# Patient Record
Sex: Male | Born: 1970 | Race: White | Hispanic: No | Marital: Single | Smoking: Never smoker
Health system: Southern US, Community
[De-identification: ages and names within clinical notes are randomized; demographics above are authoritative.]

## PROBLEM LIST (undated history)

## (undated) DIAGNOSIS — I10 Essential (primary) hypertension: Secondary | ICD-10-CM

## (undated) DIAGNOSIS — E781 Pure hyperglyceridemia: Secondary | ICD-10-CM

## (undated) DIAGNOSIS — Z800 Family history of malignant neoplasm of digestive organs: Secondary | ICD-10-CM

## (undated) DIAGNOSIS — E119 Type 2 diabetes mellitus without complications: Secondary | ICD-10-CM

## (undated) DIAGNOSIS — E559 Vitamin D deficiency, unspecified: Secondary | ICD-10-CM

## (undated) DIAGNOSIS — K859 Acute pancreatitis without necrosis or infection, unspecified: Secondary | ICD-10-CM

## (undated) DIAGNOSIS — K760 Fatty (change of) liver, not elsewhere classified: Secondary | ICD-10-CM

## (undated) DIAGNOSIS — IMO0002 Reserved for concepts with insufficient information to code with codable children: Secondary | ICD-10-CM

## (undated) DIAGNOSIS — J309 Allergic rhinitis, unspecified: Secondary | ICD-10-CM

## (undated) HISTORY — DX: Family history of malignant neoplasm of digestive organs: Z80.0

## (undated) HISTORY — DX: Reserved for concepts with insufficient information to code with codable children: IMO0002

## (undated) HISTORY — DX: Hypercalcemia: E83.52

## (undated) HISTORY — DX: Type 2 diabetes mellitus without complications: E11.9

## (undated) HISTORY — DX: Fatty (change of) liver, not elsewhere classified: K76.0

## (undated) HISTORY — DX: Pure hyperglyceridemia: E78.1

## (undated) HISTORY — DX: Vitamin D deficiency, unspecified: E55.9

## (undated) HISTORY — DX: Allergic rhinitis, unspecified: J30.9

## (undated) HISTORY — DX: Essential (primary) hypertension: I10

## (undated) HISTORY — DX: Acute pancreatitis without necrosis or infection, unspecified: K85.90

---

## 2006-07-07 ENCOUNTER — Ambulatory Visit: Payer: Self-pay | Admitting: Gastroenterology

## 2012-09-26 LAB — URINALYSIS, COMPLETE
Bilirubin,UR: NEGATIVE
Glucose,UR: >=500 mg/dL (ref 0–75)
Leukocyte Esterase: NEGATIVE
Nitrite: NEGATIVE
Ph: 5 (ref 4.5–8.0)
Protein: 100
RBC,UR: 5 /HPF (ref 0–5)
WBC UR: <1 /HPF (ref 0–5)

## 2012-09-26 LAB — CBC
HCT: 43.6 % (ref 40.0–52.0)
Platelet: 344 10*3/uL (ref 150–440)
RBC: 5.18 10*6/uL (ref 4.40–5.90)
RDW: 12.9 % (ref 11.5–14.5)
WBC: 19.7 10*3/uL — ABNORMAL HIGH (ref 3.8–10.6)

## 2012-09-27 ENCOUNTER — Inpatient Hospital Stay: Payer: Self-pay | Admitting: Internal Medicine

## 2012-09-27 LAB — COMPREHENSIVE METABOLIC PANEL
Alkaline Phosphatase: 126 U/L (ref 50–136)
Anion Gap: 10 (ref 7–16)
Bilirubin,Total: 0.8 mg/dL (ref 0.2–1.0)
Calcium, Total: 9.2 mg/dL (ref 8.5–10.1)
Chloride: 93 mmol/L — ABNORMAL LOW (ref 98–107)
Co2: 26 mmol/L (ref 21–32)
Creatinine: 0.66 mg/dL (ref 0.60–1.30)
EGFR (African American): >60
EGFR (Non-African Amer.): >60
Osmolality: 273 (ref 275–301)
SGOT(AST): 60 U/L — ABNORMAL HIGH (ref 15–37)
SGPT (ALT): 50 U/L (ref 12–78)
Sodium: 129 mmol/L — ABNORMAL LOW (ref 136–145)

## 2012-09-27 LAB — LIPASE, BLOOD
Lipase: >10000 U/L — ABNORMAL HIGH (ref 73–393)
Lipase: 6131 U/L — ABNORMAL HIGH (ref 73–393)

## 2012-09-28 LAB — LIPID PANEL
Cholesterol: 381 mg/dL — ABNORMAL HIGH
HDL Cholesterol: 24 mg/dL — ABNORMAL LOW
Triglycerides: 1302 mg/dL — ABNORMAL HIGH

## 2012-09-28 LAB — CBC WITH DIFFERENTIAL/PLATELET
Basophil #: 0.1 10*3/uL (ref 0.0–0.1)
Basophil %: 0.8 %
Eosinophil #: 0.1 10*3/uL (ref 0.0–0.7)
MCH: 30.2 pg (ref 26.0–34.0)
MCV: 84 fL (ref 80–100)
Monocyte #: 0.6 x10 3/mm (ref 0.2–1.0)
Monocyte %: 4.3 %
Neutrophil %: 83.2 %
RBC: 4.53 10*6/uL (ref 4.40–5.90)

## 2012-09-28 LAB — BASIC METABOLIC PANEL
Calcium, Total: 8.4 mg/dL — ABNORMAL LOW (ref 8.5–10.1)
Co2: 19 mmol/L — ABNORMAL LOW (ref 21–32)
EGFR (Non-African Amer.): >60
Osmolality: 273 (ref 275–301)
Potassium: 3.8 mmol/L (ref 3.5–5.1)
Sodium: 134 mmol/L — ABNORMAL LOW (ref 136–145)

## 2012-09-28 LAB — LIPASE, BLOOD: Lipase: 2910 U/L — ABNORMAL HIGH

## 2012-10-01 LAB — CULTURE, BLOOD (SINGLE)

## 2014-03-12 ENCOUNTER — Ambulatory Visit: Payer: Self-pay | Admitting: Urgent Care

## 2014-03-12 LAB — IRON AND TIBC
Iron Bind.Cap.(Total): 387 ug/dL (ref 250–450)
Iron Saturation: 21 %
Iron: 82 ug/dL (ref 65–175)
Unbound Iron-Bind.Cap.: 305 ug/dL

## 2014-03-12 LAB — FERRITIN: Ferritin (ARMC): 379 ng/mL (ref 8–388)

## 2014-03-13 ENCOUNTER — Ambulatory Visit: Payer: Self-pay | Admitting: Urgent Care

## 2014-03-14 ENCOUNTER — Emergency Department: Payer: Self-pay | Admitting: Emergency Medicine

## 2014-03-21 ENCOUNTER — Ambulatory Visit: Payer: Self-pay | Admitting: Urgent Care

## 2014-04-02 ENCOUNTER — Ambulatory Visit: Payer: Self-pay | Admitting: Urgent Care

## 2014-04-03 ENCOUNTER — Other Ambulatory Visit: Payer: Self-pay | Admitting: Urgent Care

## 2014-04-04 ENCOUNTER — Other Ambulatory Visit: Payer: Self-pay | Admitting: Urgent Care

## 2014-05-02 ENCOUNTER — Ambulatory Visit: Payer: Self-pay | Admitting: Surgery

## 2014-05-02 LAB — CBC WITH DIFFERENTIAL/PLATELET
Basophil #: 0.1 10*3/uL (ref 0.0–0.1)
Basophil %: 1.3 %
Eosinophil #: 0.1 10*3/uL (ref 0.0–0.7)
Eosinophil %: 2.4 %
HCT: 41.4 % (ref 40.0–52.0)
HGB: 14.2 g/dL (ref 13.0–18.0)
LYMPHS ABS: 1.5 10*3/uL (ref 1.0–3.6)
Lymphocyte %: 26.3 %
MCH: 29.1 pg (ref 26.0–34.0)
MCHC: 34.3 g/dL (ref 32.0–36.0)
MCV: 85 fL (ref 80–100)
MONO ABS: 0.4 x10 3/mm (ref 0.2–1.0)
Monocyte %: 6.2 %
Neutrophil #: 3.7 10*3/uL (ref 1.4–6.5)
Neutrophil %: 63.8 %
PLATELETS: 296 10*3/uL (ref 150–440)
RBC: 4.88 10*6/uL (ref 4.40–5.90)
RDW: 12.9 % (ref 11.5–14.5)
WBC: 5.7 10*3/uL (ref 3.8–10.6)

## 2014-05-02 LAB — BASIC METABOLIC PANEL
Anion Gap: 12 (ref 7–16)
BUN: 15 mg/dL
CHLORIDE: 99 mmol/L — AB
Calcium, Total: 9.5 mg/dL
Co2: 24 mmol/L
Creatinine: 0.74 mg/dL
EGFR (African American): >60
EGFR (Non-African Amer.): >60
GLUCOSE: 224 mg/dL — AB
Potassium: 3.7 mmol/L
Sodium: 135 mmol/L

## 2014-05-02 LAB — PROTIME-INR
INR: 1
PROTHROMBIN TIME: 13 s

## 2014-05-03 LAB — HEPATIC FUNCTION PANEL A (ARMC)
Albumin: 4.5 g/dL
Alkaline Phosphatase: 56 U/L
Bilirubin, Direct: <0.1 mg/dL
Bilirubin,Total: 0.4 mg/dL
SGOT(AST): 104 U/L — ABNORMAL HIGH
SGPT (ALT): 135 U/L — ABNORMAL HIGH
Total Protein: 8.1 g/dL

## 2014-05-09 ENCOUNTER — Ambulatory Visit
Admit: 2014-05-09 | Disposition: A | Discharge status: Home / Self Care | Payer: Self-pay | Attending: Surgery | Admitting: Surgery

## 2014-05-09 HISTORY — PX: LAPAROSCOPIC CHOLECYSTECTOMY: SUR755

## 2014-05-23 ENCOUNTER — Other Ambulatory Visit
Admit: 2014-05-23 | Disposition: A | Discharge status: Home / Self Care | Payer: Self-pay | Attending: Urgent Care | Admitting: Urgent Care

## 2014-05-23 LAB — HEPATIC FUNCTION PANEL A (ARMC)
AST: 99 U/L — AB
Albumin: 4.2 g/dL
Alkaline Phosphatase: 67 U/L
Bilirubin, Direct: <0.1 mg/dL
Bilirubin,Total: 0.6 mg/dL
SGPT (ALT): 127 U/L — ABNORMAL HIGH
Total Protein: 7.9 g/dL

## 2014-05-25 NOTE — H&P (Signed)
PATIENT NAME:  Gregory Bowman, Gregory Bowman MR#:  [id number] DATE OF BIRTH:  [birth year]  DATE OF ADMISSION:  09/27/2012  PRIMARY CARE PHYSICIAN: Dr. Rosario Jacks    REFERRING PHYSICIAN: Dr. Dahlia Client.  CHIEF COMPLAINT: Abdominal pain.   HISTORY OF PRESENT ILNESS:  Gregory Bowman is a 44 year old pleasant white male with a past medical history of diabetes mellitus on oral medication, hypertension, hyperlipidemia, presented to the Emergency Department with a complaint of abdominal pain  that started on Sunday afternoon. The patient had an episode of diarrhea on Sunday morning.  He ate salad around lunch time.  He started to experience pain in the epigastric and above the umbilicus, constant pain.  He felt nauseated a few times, did not have any episodes of vomiting.  As the patient continued to have this pain, he came to the Emergency Department. Work-up in the Emergency Department revealed the patient has a lipase of greater than 10,000. Right upper quadrant ultrasound was done which was negative for any gallstones. He drinks alcohol occasionally; however, denies drinking any alcohol recently. The blood drawn in the Emergency Department showed severely lipemic. The patient does not remember his last hemoglobin A1c.   PAST MEDICAL HISTORY: 1.  Hypertension.  2.  Hyperlipidemia.  3.  Diabetes mellitus, on oral medication.   PAST SURGICAL HISTORY:  A cyst removed from the back.   ALLERGIES: No known drug allergies.   HOME MEDICATIONS: 1.  Onglyza 5 mg 1 tablet once a day.  2.  Metoprolol 25 mg 2 times a day.  3.  Metformin 1000 mg 2 times a day.  4.  Lisinopril 5 mg once a day.   SOCIAL HISTORY: Denies smoking, drinking alcohol or using illicit drugs. He lives with his mother. Works Therapist, art.    FAMILY HISTORY: Father with colon cancer, heart problems and diabetes mellitus. Mother with multiple medical problems including hypertension, hyperlipidemia, diabetes mellitus and colon cancer.   REVIEW OF  SYSTEMS: CONSTITUTIONAL: Denies any generalized weakness.  EYES: No change in vision.  ENT: No change in hearing. No sore throat.  RESPIRATORY: No cough, shortness of breath.  CARDIOVASCULAR: No chest pain, palpitations.  GASTROINTESTINAL:  He has abdominal pain. No nausea or vomiting.  GENITOURINARY: No dysuria or hematuria.  SKIN: No rash or lesions.  ENDOCRINE: No polyuria or polydipsia; however, the patient has diagnosis of diabetes mellitus.  HEMATOLOGY: No easy bruising and bleeding.  MUSCULOSKELETAL: No joint pains and aches.  NEUROLOGIC:  No weakness or numbness in any part of the body.  PSYCHIATRIC: Denies any anxiety, depression.   PHYSICAL EXAMINATION: GENERAL: This is a well built, well-nourished, age-appropriate male lying down in the bed, not in distress.  VITAL SIGNS: Temperature is 98.7, pulse 112, blood pressure 147/90, respiratory rate of 18, oxygen saturation 98% on room air.  HEENT: Head normocephalic, atraumatic.  No scleral icterus. Conjunctivae normal. Pupils equal and reactive to light. Mucous membranes dry. No pharyngeal erythema.  NECK: Supple. No lymphadenopathy. No JVD. No carotid bruit. No thyromegaly.  CHEST: Has no focal tenderness.  LUNGS:  Bilateral clear to auscultation.    HEART: S1, S2, regular. No murmurs are heard. No pedal edema. Pulses 2+ in both feet.  ABDOMEN: Bowel sounds plus. Soft, has mild tenderness in the epigastric area. No rebound or guarding. Could not appreciate hepatosplenomegaly secondary to the patient's pain.  SKIN: No rash or lesions.  MUSCULOSKELETAL: Good range of motion in all the extremities.  NEUROLOGIC: The patient is alert, oriented to place, person  and time. Cranial nerves II through XII intact. Motor 5/5 in upper and lower extremities.  No sensory deficits.   LABORATORY DATA:  Complete metabolic panel: Sodium 242, chloride 89, calcium   CBC: WBC of 19.7, hemoglobin 43, platelet count of 344. Urinalysis negative for nitrites  and leukocyte esterase. Lipase greater than 10,000. The rest of all the values are within normal limits.   ASSESSMENT AND PLAN: Gregory Bowman is a 44 year old male who comes to the Emergency Department with acute pancreatitis.  1. Acute pancreatitis. No history of alcohol use, negative for any gallstones. Very highly concerning about hypertriglyceridemia. Admit the patient to the medical bed to continue with IV fluids, pain management. Obtain lipid profile. Until then, we will keep the patient on Lopid.  2.  Diabetes mellitus. We will hold the Onglyza and metformin; keep the patient on sliding scale insulin.  3.  Hypertension. Currently well controlled, we will hold lisinopril for now.  4.  Keep the patient on deep vein thrombosis prophylaxis with Lovenox.  5.  Hyponatremia most likely secondary to hypertriglyceridemia and the dehydration. Continue with IV fluids and ordered lipid profile.  TIme spent 45 min      ____________________________ Monica Becton, MD pv:cc D: 09/27/2012 01:49:00 ET T: 09/27/2012 02:47:13 ET JOB#: [id number]  cc: Monica Becton, MD, <Dictator> Grier Mitts Merion Caton MD ELECTRONICALLY SIGNED 10/05/2012 22:10

## 2014-05-25 NOTE — Discharge Summary (Signed)
PATIENT NAME:  Gregory Bowman, Gregory Bowman MR#:  [id number] DATE OF BIRTH:  [birth year]  DATE OF ADMISSION:  09/27/2012 DATE OF DISCHARGE:  09/29/2012  DISCHARGE DIAGNOSES: 1.  Acute pancreatitis secondary to hypertriglyceridemia. Abdominal pain resolved. 2.  Severe hypertriglyceridemia.  3.  Hypertension.  4.  Diabetes mellitus type 2.  DISCHARGE MEDICATIONS: 1.  Metformin 1 gram p.o. b.i.d.  2.  Lisinopril 5 mg p.o. daily. 3.  Metoprolol 25 mg p.o. b.i.d. 4.  Onglyza 5 mg p.o. daily.  5.  Gemfibrozil 600 mg p.o. b.i.d.  6.  Omega 3 fatty acids 2 grams p.o. b.i.d.  7.  Levaquin 500 mg every 24 hours for 5 days.   DIET: Low fat, ADA.  CONSULTATIONS: None.   PRIMARY CARE PHYSICIAN: Casilda Carls, MD; the patient is to follow up with him in 3 months.   HOSPITAL COURSE: A 44 year old male patient admitted for abdominal pain and acute pancreatitis. The patient's lipase on admission 10,000. The patient was kept n.p.o., started on IV fluids. He was also started on empiric antibiotics because his white count was 19.7 on admission. The patient's abdominal pain resolved the following day, and the patient did not have any nausea. Started on liquid diet. Lipase did come down from 10,000 to 6,000. The patient's LFTs were within normal limits with AST 50 ALT 60 and alkaline phosphatase 126. He also had an abdominal ultrasound which showed hepatic steatosis without any cholecystitis. The patient's lipase was 851 yesterday. He did not have anymore abdominal pain. The diet was advanced to regular diet. His labs showed LDL unable to be commented because his triglycerides are very elevated at 1302. Started on Lopid and also omega-3 fatty acids. The patient is advised to continue Lopid and also omega-3 fatty acids. The patient advised to be on low fat diet, follow with Dr. Rosario Jacks in about 3 months to have repeat triglyceride level checked. The patient understands these instructions, and we gave a prescription for  gemfibrozil and Levaquin. The patient's white count did improve from 19.7 to 14.8 at the time of discharge. He was afebrile, tolerating the diet. We did not give him Metformin when he came because he was n.p.o. and had dehydration, but I told him to restart the metformin at the time of discharge. The patient's blood pressure is fine and he is stable at the time of discharge.   TIME SPENT: More than 30 minutes. ____________________________ Epifanio Lesches, MD sk:sb D: 09/30/2012 13:46:36 ET T: 09/30/2012 14:07:51 ET JOB#: [id number]  cc: Epifanio Lesches, MD, <Dictator> Isla Pence, MD Epifanio Lesches MD ELECTRONICALLY SIGNED 10/14/2012 22:20

## 2014-05-28 LAB — SURGICAL PATHOLOGY

## 2014-06-03 NOTE — H&P (Signed)
PATIENT NAME:  Gregory Bowman, Gregory Bowman MR#:  [id number] DATE OF BIRTH:  28-Dec-1970  DATE OF ADMISSION:  05/09/2014  CHIEF COMPLAINT: History of pancreatitis.   HISTORY OF PRESENT ILLNESS: This patient has had recurrent pancreatitis of unclear etiology, has not required hospitalization, but he has had signs of pancreatitis in the past. He has known gallstones, but the etiology of his pancreatitis has never been clearly related to his gallstones, but no other etiologies have been identified.  His liver function tests have not been elevated in the past, but with known gallbladder disease laparoscopic cholecystectomy is warranted with the patient fully understanding that this may not resolve all of his symptoms or his risk for future pancreatitis, as the link to his gallbladder is not clearcut at this point.   The patient has been seen by Vickey Huger, NP in Dr. Dorothey Baseman office for workup of pancreatitis and we have discussed his history and the agreement for laparoscopic cholecystectomy is in place.   PAST SURGICAL HISTORY: None.   PAST MEDICAL HISTORY: Hepatic steatosis, prior elevated liver function tests, pancreatitis, diabetes, allergic rhinitis, hypertriglyceridemia, hypertension, hypercalcemia.   ALLERGIES: None.   MEDICATIONS: Multiple, see reconciliation.   SOCIAL HISTORY: The patient lives alone. He does not smoke or drink. Has never been a heavy drinker.   FAMILY HISTORY: Noncontributory  REVIEW OF SYSTEMS: A complete system review has been performed and is present in the office chart.   PHYSICAL EXAMINATION:  GENERAL: Healthy-appearing male patient.  HEENT: No scleral icterus.  NECK: No palpable neck nodes.  CHEST: Clear to auscultation.  CARDIAC: Regular rate and rhythm.  ABDOMEN: Soft, nontender.  EXTREMITIES: Without edema.  NEUROLOGIC: Grossly intact.  INTEGUMENT: No jaundice.   LABORATORY DATA:  Complete review of the patient's laboratory reports most recent demonstrates a  normal bilirubin, the AST and ALT in this situation are elevated at 104 and 135 respectively with a normal alkaline phosphatase.   LABORATORY DATA: White blood cell count is 5.7, H and H of 14 and 41, and a platelet count of 296,000. An ultrasound was found to have shown multiple gallstones.   ASSESSMENT AND PLAN: This is a patient with a history of known gallstones, slightly elevated liver function tests with normal alkaline phosphatase, and frequent pancreatitis episodes, unclear if this is related to his gallstones but no other etiology has been clearly identified.  We have discussed the rationale for offering surgery. I have discussed this also with Vickey Huger, NP, who referred him to me for possible surgical intervention. The patient understands that this may not decrease his risk for future pancreatitis, but that it is the only clearcut relation that we have at this point other than his hypertriglyceridemia which has never been very highly elevated. The rationale for surgery has been discussed. The options of observation have been reviewed. The risks of bleeding, infection, recurrence of disease, recurrence of symptoms, failure to resolve his symptoms or risk for pancreatitis, and the risks of bile duct damage, bile duct leak, or retained stone, any of which could require further surgery and/or ERCP stent and papillotomy, have all been discussed. He understood and agreed to proceed.     ____________________________ Jerrol Banana Burt Knack, MD rec:bu D: 05/07/2014 15:23:30 ET T: 05/07/2014 15:48:20 ET JOB#: [id number]  cc: Jerrol Banana. Burt Knack, MD, <Dictator> Florene Glen MD ELECTRONICALLY SIGNED 05/07/2014 18:03

## 2014-06-03 NOTE — Op Note (Signed)
PATIENT NAME:  Gregory Bowman, Gregory Bowman MR#:  [id number] DATE OF BIRTH:  April 11, 1970  DATE OF PROCEDURE:  05/09/2014  PREOPERATIVE DIAGNOSIS: History of pancreatitis.   POSTOPERATIVE DIAGNOSIS:  History of pancreatitis.  PROCEDURE: Laparoscopic cholecystectomy, C-arm fluoroscopic cholangiography, and liver biopsy.   SURGEON: Zaydan Papesh E. Burt Knack, M.D.   ANESTHESIA: General with endotracheal tube.   ASSISTANT: Oneal Grout, PA-S.   INDICATIONS: This is a patient with a history of recurrent pancreatitis of unclear etiology who has known gallstones. His liver function pattern has not always fit the case for biliary pancreatitis, but his GI physician Dr. Allen Norris and Tamsen Snider, NP have requested cholecystectomy as has the patient.  They also requested a liver biopsy for ongoing elevated liver function tests.   Preoperatively, we discussed rationale for surgery, the options of observation, the risk of bleeding, infection, recurrence of symptoms, failure to resolve his symptoms, failure to alleviate his risk for pancreatitis. This was emphasized. We also discussed the risks of bile duct damage, bile duct leak, retained common bile duct stone, any of which could require further surgery and/or ERCP, stent, and papillotomy. This was all reviewed for him. He understood and agreed to proceed.   FINDINGS: Some area of scar of adhesions to the gallbladder suggesting prior gallbladder attacks.  No palpable stones, but sludge present. Liver appeared much like a "nutmeg" liver with fatty infiltration. Biopsies were taken from the medial and lateral right lobe and sent off together in 1 container.   C-arm fluoroscopic cholangiography demonstrated good flow in the duodenum. The pancreatic duct was dilated and almost as large as the distal common bile duct which was small.  The cystic duct had been cannulated and the proximal ducts were well identified.   DESCRIPTION OF PROCEDURE: The patient was induced to general  anesthesia. He was given IV antibiotics. VTE prophylaxis was in place. He was prepped and draped in a sterile fashion. Marcaine was infiltrated in skin and subcutaneous tissues around the periumbilical area. Incision was made. Veress needle was placed. Pneumoperitoneum was obtained. A 5 mm trocar port was placed. The abdominal cavity was explored.  Photos of the liver were taken and under direct vision a 10 mm epigastric port and 2 lateral 5 mm ports were placed. The gallbladder was placed on tension and then a liver biopsy was performed utilizing rigid cup forceps.  Both subcapsular and deep tissue was taken and sent off for examination.  These were taken from 2 separate areas as above.   No electrocautery hemostasis was used at this time.   Attention was returned to the gallbladder were the peritoneum over the infundibulum was incised bluntly. The cystic duct gallbladder junction was well identified. It was clipped and incised, and through a separate incision an Angiocath cholangiogram catheter was placed. C-arm fluoroscopic cholangiography demonstrated the above and the cystic duct catheter was then removed. The cystic duct was doubly clipped and divided and the cystic artery was well identified, doubly clipped, and divided, and the gallbladder was taken from the gallbladder fossa with electrocautery and passed out through the epigastric port site with the aid of an Endo Catch bag. The area was checked for hemostasis and found to be adequate. There was no bleeding from the liver biopsy site. There was some bleeding from the lateral peritoneal reflection of the gallbladder fossa.  Two clips were placed on this and no further bleeding was noted. There was no bleeding from the liver biopsy sites at this point. The area was irrigated with  copious amounts of normal saline which was aspirated and then the camera was placed in the epigastric site to view back to the periumbilical site. No sign of adhesions or bowel  injury. Therefore, pneumoperitoneum was released. All ports were removed. Fascial edges of the epigastric site were approximated with figure-of-eight 0 Vicryls, 4-0 subcuticular Monocryl was used on all skin edges. Steri-Strips, Mastisol, and sterile dressings were placed.   The patient tolerated the procedure well. There were no complications. He was taken to the recovery room in stable condition to be discharged to the care of his family.  Follow up in 10 days.   ____________________________ Jerrol Banana Burt Knack, MD rec:sp D: 05/09/2014 08:16:43 ET T: 05/09/2014 09:23:00 ET JOB#: [id number]  cc: Jerrol Banana. Burt Knack, MD, <Dictator> Florene Glen MD ELECTRONICALLY SIGNED 05/09/2014 12:26

## 2014-10-15 ENCOUNTER — Encounter: Payer: Self-pay | Admitting: Gastroenterology

## 2014-10-31 ENCOUNTER — Other Ambulatory Visit: Payer: Self-pay

## 2014-10-31 DIAGNOSIS — K760 Fatty (change of) liver, not elsewhere classified: Secondary | ICD-10-CM | POA: Insufficient documentation

## 2014-10-31 DIAGNOSIS — K859 Acute pancreatitis without necrosis or infection, unspecified: Secondary | ICD-10-CM | POA: Insufficient documentation

## 2014-10-31 DIAGNOSIS — E1165 Type 2 diabetes mellitus with hyperglycemia: Secondary | ICD-10-CM | POA: Insufficient documentation

## 2014-10-31 DIAGNOSIS — Z8719 Personal history of other diseases of the digestive system: Secondary | ICD-10-CM | POA: Insufficient documentation

## 2014-10-31 DIAGNOSIS — I10 Essential (primary) hypertension: Secondary | ICD-10-CM | POA: Insufficient documentation

## 2014-10-31 DIAGNOSIS — E781 Pure hyperglyceridemia: Secondary | ICD-10-CM | POA: Insufficient documentation

## 2014-10-31 DIAGNOSIS — Z800 Family history of malignant neoplasm of digestive organs: Secondary | ICD-10-CM | POA: Insufficient documentation

## 2014-10-31 DIAGNOSIS — J309 Allergic rhinitis, unspecified: Secondary | ICD-10-CM | POA: Insufficient documentation

## 2014-10-31 DIAGNOSIS — IMO0002 Reserved for concepts with insufficient information to code with codable children: Secondary | ICD-10-CM | POA: Insufficient documentation

## 2014-11-01 ENCOUNTER — Encounter: Payer: Self-pay | Admitting: Gastroenterology

## 2014-11-01 ENCOUNTER — Other Ambulatory Visit: Payer: Self-pay

## 2014-11-01 ENCOUNTER — Ambulatory Visit (INDEPENDENT_AMBULATORY_CARE_PROVIDER_SITE_OTHER): Payer: BLUE CROSS/BLUE SHIELD | Admitting: Gastroenterology

## 2014-11-01 VITALS — BP 147/80 | HR 84 | Temp 98.6°F | Ht 73.5 in | Wt 254.0 lb

## 2014-11-01 DIAGNOSIS — K7581 Nonalcoholic steatohepatitis (NASH): Secondary | ICD-10-CM

## 2014-11-01 DIAGNOSIS — R748 Abnormal levels of other serum enzymes: Secondary | ICD-10-CM

## 2014-11-01 NOTE — Progress Notes (Signed)
   Primary Care Physician: Casilda Carls, MD  Primary Gastroenterologist:  Dr. Lucilla Lame  Chief Complaint  Patient presents with  . Elevated Hepatic Enzymes    HPI: Gregory Bowman is a 44 y.o. male here For follow-up of abnormal liver enzymes. The patient has not lost any weight. He reports that he tried to keep his diabetes and high cholesterol of the control. He had a liver biopsy that showed him to have Grade 2 stage I Liver damage.  Current Outpatient Prescriptions  Medication Sig Dispense Refill  . atorvastatin (LIPITOR) 10 MG tablet   0  . Cinnamon 500 MG TABS Take by mouth.    Marland Kitchen gemfibrozil (LOPID) 600 MG tablet Take by mouth.    . insulin aspart (NOVOLOG FLEXPEN) 100 UNIT/ML FlexPen Inject into the skin.    . Insulin Glargine (TOUJEO SOLOSTAR) 300 UNIT/ML SOPN Inject into the skin.    Marland Kitchen lisinopril (PRINIVIL,ZESTRIL) 5 MG tablet Take by mouth.    . metFORMIN (GLUCOPHAGE) 1000 MG tablet Take by mouth.    . metoprolol tartrate (LOPRESSOR) 25 MG tablet Take by mouth.    . omega-3 acid ethyl esters (LOVAZA) 1 G capsule   0  . Omega-3 Fatty Acids (FISH OIL TRIPLE STRENGTH) 1400 MG CAPS Take by mouth.    . Vitamin D, Ergocalciferol, (DRISDOL) 50000 UNITS CAPS capsule   3  . ascorbic acid (VITAMIN C) 1000 MG tablet Take by mouth.    . B Complex-C-Folic Acid (B COMPLEX + C TR PO) Take by mouth.     No current facility-administered medications for this visit.    Allergies as of 11/01/2014  . (No Known Allergies)    ROS:  General: Negative for anorexia, weight loss, fever, chills, fatigue, weakness. ENT: Negative for hoarseness, difficulty swallowing , nasal congestion. CV: Negative for chest pain, angina, palpitations, dyspnea on exertion, peripheral edema.  Respiratory: Negative for dyspnea at rest, dyspnea on exertion, cough, sputum, wheezing.  GI: See history of present illness. GU:  Negative for dysuria, hematuria, urinary incontinence, urinary frequency, nocturnal  urination.  Endo: Negative for unusual weight change.    Physical Examination:   BP 147/80 mmHg  Pulse 84  Temp(Src) 98.6 F (37 C) (Oral)  Ht 6' 1.5" (1.867 m)  Wt 254 lb (115.214 kg)  BMI 33.05 kg/m2  General: Well-nourished, well-developed in no acute distress.  Eyes: No icterus. Conjunctivae pink. Mouth: Oropharyngeal mucosa moist and pink , no lesions erythema or exudate. Lungs: Clear to auscultation bilaterally. Non-labored. Heart: Regular rate and rhythm, no murmurs rubs or gallops.  Abdomen: Bowel sounds are normal, nontender, nondistended, no hepatosplenomegaly or masses, no abdominal bruits or hernia , no rebound or guarding.   Extremities: No lower extremity edema. No clubbing or deformities. Neuro: Alert and oriented x 3.  Grossly intact. Skin: Warm and dry, no jaundice.   Psych: Alert and cooperative, normal mood and affect.  Labs:    Imaging Studies: No results found.  Assessment and Plan:   Gregory Bowman is a 44 y.o. y/o male who has Abnormal liver enzymes with fatty liver on biopsy. The patient reports that his most recent lab showed liver enzymes to be higher than before. The patient has been encouraged to try and lose weight. He agrees tothe plan and will follow as needed.  Note: This dictation was prepared with Dragon dictation along with smaller phrase technology. Any transcriptional errors that result from this process are unintentional.

## 2014-11-08 NOTE — Telephone Encounter (Signed)
error 

## 2015-10-21 ENCOUNTER — Ambulatory Visit (INDEPENDENT_AMBULATORY_CARE_PROVIDER_SITE_OTHER): Payer: BLUE CROSS/BLUE SHIELD | Admitting: Gastroenterology

## 2015-10-21 ENCOUNTER — Other Ambulatory Visit: Payer: Self-pay

## 2015-10-21 ENCOUNTER — Encounter: Payer: Self-pay | Admitting: Gastroenterology

## 2015-10-21 VITALS — BP 156/89 | HR 76 | Temp 97.6°F | Ht 73.5 in | Wt 253.0 lb

## 2015-10-21 DIAGNOSIS — R748 Abnormal levels of other serum enzymes: Secondary | ICD-10-CM

## 2015-10-21 NOTE — Progress Notes (Signed)
Primary Care Physician: Casilda Carls, MD  Primary Gastroenterologist:  Dr. Lucilla Lame  Chief Complaint  Patient presents with  . Elevated Lipase    HPI: Gregory Bowman is a 45 y.o. male here for increased lipase.  The patient has a history of pancreatitis with his liver enzymes  Being in the thousands.  The patient then had resolution of his pancreatitis rapidly with near normalization of his lipase. The patient now reports that he was told by his PCP that his lipase is still elevated.  The patient most recent was  122.  The patient also see me in the past for fatty liver with abnormal liver enzymes.  The patient's workup showed him on liver biopsy to have significant steatohepatitis.   It was told to lose weight and follow up after that.  The patient did follow-up but had not lost any weight.  The patient still reports that he is not significantly less weight then he was in the past.  He denies any alcohol abuse and reports that he has been told that he has chronic pancreatitis despite a CT scan done last year that not show any signs of chronic pancreatitis.  He also denies any abdominal pain or stearrhea consistent with chronic pancreatitis  Current Outpatient Prescriptions  Medication Sig Dispense Refill  . atorvastatin (LIPITOR) 10 MG tablet   0  . B Complex-C-Folic Acid (B COMPLEX + C TR PO) Take by mouth.    . Cinnamon 500 MG TABS Take by mouth.    Marland Kitchen gemfibrozil (LOPID) 600 MG tablet Take by mouth.    . insulin aspart (NOVOLOG FLEXPEN) 100 UNIT/ML FlexPen Inject into the skin.    . Insulin Glargine (TOUJEO SOLOSTAR) 300 UNIT/ML SOPN Inject into the skin.    Marland Kitchen lisinopril (PRINIVIL,ZESTRIL) 5 MG tablet Take 10 mg by mouth daily.     . metFORMIN (GLUCOPHAGE) 1000 MG tablet Take by mouth.    . metoprolol tartrate (LOPRESSOR) 25 MG tablet Take by mouth.    . omega-3 acid ethyl esters (LOVAZA) 1 G capsule   0  . Vitamin D, Ergocalciferol, (DRISDOL) 50000 UNITS CAPS capsule   3  .  Omega-3 Fatty Acids (FISH OIL TRIPLE STRENGTH) 1400 MG CAPS Take by mouth.     No current facility-administered medications for this visit.     Allergies as of 10/21/2015  . (No Known Allergies)    ROS:  General: Negative for anorexia, weight loss, fever, chills, fatigue, weakness. ENT: Negative for hoarseness, difficulty swallowing , nasal congestion. CV: Negative for chest pain, angina, palpitations, dyspnea on exertion, peripheral edema.  Respiratory: Negative for dyspnea at rest, dyspnea on exertion, cough, sputum, wheezing.  GI: See history of present illness. GU:  Negative for dysuria, hematuria, urinary incontinence, urinary frequency, nocturnal urination.  Endo: Negative for unusual weight change.    Physical Examination:   BP (!) 156/89   Pulse 76   Temp 97.6 F (36.4 C) (Oral)   Ht 6' 1.5" (1.867 m)   Wt 253 lb (114.8 kg)   BMI 32.93 kg/m   General: Well-nourished, well-developed in no acute distress.  Eyes: No icterus. Conjunctivae pink. Mouth: Oropharyngeal mucosa moist and pink , no lesions erythema or exudate. Lungs: Clear to auscultation bilaterally. Non-labored. Heart: Regular rate and rhythm, no murmurs rubs or gallops.  Abdomen: Bowel sounds are normal, nontender, nondistended, no hepatosplenomegaly or masses, no abdominal bruits or hernia , no rebound or guarding.   Extremities: No lower extremity edema. No  clubbing or deformities. Neuro: Alert and oriented x 3.  Grossly intact. Skin: Warm and dry, no jaundice.   Psych: Alert and cooperative, normal mood and affect.  Labs:    Imaging Studies: No results found.  Assessment and Plan:   Gregory Bowman is a 45 y.o. y/o male who comes in today with chronically elevated lipase.  The patient will have his lipase checked again today and he will also have his blood sent off for an IgG4 hepatitis. The patient has also been told that if his lipase remains elevated and his IgG 4 is normal he may need to undergo  a  Endoscopic ultrasound to more closely look at the pancreas.  We will also consider an MRCP for possible pancreatic divisum as a cause of his elevated lipase.  The patient has been explained the plan and agrees with it   Note: This dictation was prepared with Dragon dictation along with smaller phrase technology. Any transcriptional errors that result from this process are unintentional.

## 2015-10-22 ENCOUNTER — Telehealth: Payer: Self-pay

## 2015-10-22 LAB — IGG 4: IgG, Subclass 4: 23 mg/dL (ref 2–96)

## 2015-10-22 LAB — LIPASE: Lipase: 72 U/L — ABNORMAL HIGH (ref 0–59)

## 2015-10-22 NOTE — Telephone Encounter (Signed)
-----   Message from Lucilla Lame, MD sent at 10/22/2015  9:04 AM EDT ----- Let the patient know that his lipase came down to 72 from 122.

## 2015-10-22 NOTE — Telephone Encounter (Signed)
Pt notified of lab result. 

## 2015-10-23 ENCOUNTER — Telehealth: Payer: Self-pay

## 2015-10-23 NOTE — Telephone Encounter (Signed)
Pt notified of lab results. Pt aware I will contact him in 1 month to repeat Lipase.

## 2015-10-23 NOTE — Telephone Encounter (Signed)
-----   Message from Lucilla Lame, MD sent at 10/22/2015 12:20 PM EDT ----- The IgG4 was negative. The patient know we should recheck his lipase in one month to see if it continues to go down before we set him up for an endoscopic ultrasound.

## 2015-11-07 ENCOUNTER — Ambulatory Visit: Payer: BLUE CROSS/BLUE SHIELD | Admitting: Gastroenterology

## 2015-11-22 ENCOUNTER — Other Ambulatory Visit: Payer: Self-pay

## 2015-11-22 ENCOUNTER — Telehealth: Payer: Self-pay

## 2015-11-22 DIAGNOSIS — R748 Abnormal levels of other serum enzymes: Secondary | ICD-10-CM

## 2015-11-22 NOTE — Telephone Encounter (Signed)
Left vm letting pt know he is due for his 1 month repeat lab. Order has been placed.

## 2015-11-22 NOTE — Telephone Encounter (Signed)
-----   Message from [place], [place] sent at 10/23/2015  2:06 PM EDT ----- Pt needs repeat Lipase.

## 2015-12-04 LAB — LIPASE: Lipase: 54 U/L (ref 13–78)

## 2015-12-06 ENCOUNTER — Telehealth: Payer: Self-pay

## 2015-12-06 NOTE — Telephone Encounter (Signed)
Pt notified of lab result. 

## 2015-12-06 NOTE — Telephone Encounter (Signed)
-----   Message from Lucilla Lame, MD sent at 12/04/2015  7:51 AM EDT ----- Let the patient know that the lipase is now normal.

## 2018-03-04 ENCOUNTER — Emergency Department
Admission: EM | Admit: 2018-03-04 | Discharge: 2018-03-04 | Disposition: A | Discharge status: Home / Self Care | Payer: No Typology Code available for payment source | Attending: Emergency Medicine | Admitting: Emergency Medicine

## 2018-03-04 ENCOUNTER — Emergency Department: Payer: No Typology Code available for payment source

## 2018-03-04 ENCOUNTER — Encounter: Payer: Self-pay | Admitting: Internal Medicine

## 2018-03-04 ENCOUNTER — Other Ambulatory Visit: Payer: Self-pay

## 2018-03-04 DIAGNOSIS — M542 Cervicalgia: Secondary | ICD-10-CM

## 2018-03-04 DIAGNOSIS — I10 Essential (primary) hypertension: Secondary | ICD-10-CM | POA: Insufficient documentation

## 2018-03-04 DIAGNOSIS — Z794 Long term (current) use of insulin: Secondary | ICD-10-CM | POA: Diagnosis not present

## 2018-03-04 DIAGNOSIS — Z79899 Other long term (current) drug therapy: Secondary | ICD-10-CM | POA: Insufficient documentation

## 2018-03-04 DIAGNOSIS — R0789 Other chest pain: Secondary | ICD-10-CM

## 2018-03-04 DIAGNOSIS — M79601 Pain in right arm: Secondary | ICD-10-CM | POA: Insufficient documentation

## 2018-03-04 DIAGNOSIS — E119 Type 2 diabetes mellitus without complications: Secondary | ICD-10-CM | POA: Diagnosis not present

## 2018-03-04 LAB — GLUCOSE, CAPILLARY
GLUCOSE-CAPILLARY: 62 mg/dL — AB (ref 70–99)
Glucose-Capillary: 102 mg/dL — ABNORMAL HIGH (ref 70–99)

## 2018-03-04 MED ORDER — ORPHENADRINE CITRATE 30 MG/ML IJ SOLN
60.0000 mg | Freq: Two times a day (BID) | INTRAMUSCULAR | Status: DC
  Administered 2018-03-04: 60 mg via INTRAMUSCULAR
  Filled 2018-03-04: qty 2

## 2018-03-04 MED ORDER — ORPHENADRINE CITRATE ER 100 MG PO TB12: 100.0000 mg | ORAL_TABLET | Freq: Two times a day (BID) | ORAL | 0 refills | Status: AC

## 2018-03-04 MED ORDER — NAPROXEN 500 MG PO TABS: 500.0000 mg | ORAL_TABLET | Freq: Two times a day (BID) | ORAL | 0 refills | Status: AC

## 2018-03-04 MED ORDER — KETOROLAC TROMETHAMINE 30 MG/ML IJ SOLN
30.0000 mg | Freq: Once | INTRAMUSCULAR | Status: AC
  Administered 2018-03-04: 30 mg via INTRAVENOUS
  Filled 2018-03-04: qty 1
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
## 2018-03-04 NOTE — ED Notes (Signed)
Pt given meal for CBG of 62
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
## 2018-03-04 NOTE — ED Notes (Signed)
Repeat BG 102, patient discharged to home.
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
## 2020-06-06 ENCOUNTER — Other Ambulatory Visit: Payer: Self-pay

## 2020-06-06 ENCOUNTER — Telehealth (INDEPENDENT_AMBULATORY_CARE_PROVIDER_SITE_OTHER): Payer: Self-pay | Admitting: Gastroenterology

## 2020-06-06 DIAGNOSIS — Z1211 Encounter for screening for malignant neoplasm of colon: Secondary | ICD-10-CM

## 2020-06-06 DIAGNOSIS — Z800 Family history of malignant neoplasm of digestive organs: Secondary | ICD-10-CM

## 2020-06-06 MED ORDER — PEG 3350-KCL-NA BICARB-NACL 420 G PO SOLR: 4000.0000 mL | Freq: Once | ORAL | 0 refills | Status: AC

## 2020-06-06 NOTE — Progress Notes (Signed)
Gastroenterology Pre-Procedure Review  Request Date: 06/18/20 Requesting Physician: Dr. Bonna Gains  PATIENT REVIEW QUESTIONS: The patient responded to the following health history questions as indicated:    1. Are you having any GI issues? no 2. Do you have a personal history of Polyps? no 3. Do you have a family history of Colon Cancer or Polyps? yes (father colon cancer) 4. Diabetes Mellitus? yes (type 2) 5. Joint replacements in the past 12 months?no 6. Major health problems in the past 3 months?no 7. Any artificial heart valves, MVP, or defibrillator?no    MEDICATIONS & ALLERGIES:    Patient reports the following regarding taking any anticoagulation/antiplatelet therapy:   Plavix, Coumadin, Eliquis, Xarelto, Lovenox, Pradaxa, Brilinta, or Effient? no Aspirin? no  Patient confirms/reports the following medications:  Current Outpatient Medications  Medication Sig Dispense Refill  . atorvastatin (LIPITOR) 10 MG tablet   0  . B Complex-C-Folic Acid (B COMPLEX + C TR PO) Take by mouth.    . Cinnamon 500 MG TABS Take by mouth.    . insulin aspart (NOVOLOG) 100 UNIT/ML FlexPen Inject into the skin.    . Insulin Glargine 300 UNIT/ML SOPN Inject into the skin.    Marland Kitchen lisinopril (PRINIVIL,ZESTRIL) 5 MG tablet Take 10 mg by mouth daily.     . metFORMIN (GLUCOPHAGE) 1000 MG tablet Take by mouth.    . metoprolol tartrate (LOPRESSOR) 25 MG tablet Take by mouth.    . Omega-3 Fatty Acids (FISH OIL TRIPLE STRENGTH) 1400 MG CAPS Take by mouth.    . polyethylene glycol-electrolytes (GAVILYTE-N WITH FLAVOR PACK) 420 g solution Take 4,000 mLs by mouth once for 1 dose. Fill container to the fill line with clear liquid.  Mix well.  Drink 8 oz every 30 minutes until entire contents have been completed. 4000 mL 0  . Vitamin D, Ergocalciferol, (DRISDOL) 50000 UNITS CAPS capsule   3  . gemfibrozil (LOPID) 600 MG tablet Take by mouth.    . omega-3 acid ethyl esters (LOVAZA) 1 G capsule  (Patient not taking:  Reported on 06/06/2020)  0   No current facility-administered medications for this visit.    Patient confirms/reports the following allergies:  No Known Allergies  No orders of the defined types were placed in this encounter.   AUTHORIZATION INFORMATION Primary Insurance: 1D#: Group #:  Secondary Insurance: 1D#: Group #:  SCHEDULE INFORMATION: Date: 06/18/20 Time: Location:[facility]

## 2020-06-18 ENCOUNTER — Ambulatory Visit: Payer: BC Managed Care – PPO | Admitting: Certified Registered Nurse Anesthetist

## 2020-06-18 ENCOUNTER — Ambulatory Visit
Admission: RE | Admit: 2020-06-18 | Discharge: 2020-06-18 | Disposition: A | Discharge status: Home / Self Care | Payer: BC Managed Care – PPO | Attending: Gastroenterology | Admitting: Gastroenterology

## 2020-06-18 ENCOUNTER — Encounter
Admission: RE | Disposition: A | Discharge status: Home / Self Care | Payer: Self-pay | Source: Home / Self Care | Attending: Gastroenterology

## 2020-06-18 ENCOUNTER — Encounter: Payer: Self-pay | Admitting: Gastroenterology

## 2020-06-18 DIAGNOSIS — D122 Benign neoplasm of ascending colon: Secondary | ICD-10-CM | POA: Diagnosis not present

## 2020-06-18 DIAGNOSIS — Z794 Long term (current) use of insulin: Secondary | ICD-10-CM | POA: Insufficient documentation

## 2020-06-18 DIAGNOSIS — Z79899 Other long term (current) drug therapy: Secondary | ICD-10-CM | POA: Diagnosis not present

## 2020-06-18 DIAGNOSIS — Z7984 Long term (current) use of oral hypoglycemic drugs: Secondary | ICD-10-CM | POA: Insufficient documentation

## 2020-06-18 DIAGNOSIS — D123 Benign neoplasm of transverse colon: Secondary | ICD-10-CM | POA: Insufficient documentation

## 2020-06-18 DIAGNOSIS — K635 Polyp of colon: Secondary | ICD-10-CM

## 2020-06-18 DIAGNOSIS — Z1211 Encounter for screening for malignant neoplasm of colon: Secondary | ICD-10-CM | POA: Diagnosis not present

## 2020-06-18 DIAGNOSIS — Z800 Family history of malignant neoplasm of digestive organs: Secondary | ICD-10-CM | POA: Diagnosis not present

## 2020-06-18 DIAGNOSIS — K529 Noninfective gastroenteritis and colitis, unspecified: Secondary | ICD-10-CM | POA: Insufficient documentation

## 2020-06-18 DIAGNOSIS — Z9049 Acquired absence of other specified parts of digestive tract: Secondary | ICD-10-CM | POA: Insufficient documentation

## 2020-06-18 DIAGNOSIS — K6289 Other specified diseases of anus and rectum: Secondary | ICD-10-CM | POA: Insufficient documentation

## 2020-06-18 DIAGNOSIS — K639 Disease of intestine, unspecified: Secondary | ICD-10-CM

## 2020-06-18 HISTORY — PX: COLONOSCOPY WITH PROPOFOL: SHX5780

## 2020-06-18 LAB — GLUCOSE, CAPILLARY: Glucose-Capillary: 93 mg/dL (ref 70–99)

## 2020-06-18 SURGERY — COLONOSCOPY WITH PROPOFOL
Anesthesia: General

## 2020-06-18 MED ORDER — PROPOFOL 10 MG/ML IV BOLUS
INTRAVENOUS | Status: DC | PRN
  Administered 2020-06-18: 80 mg via INTRAVENOUS

## 2020-06-18 MED ORDER — SODIUM CHLORIDE 0.9 % IV SOLN: INTRAVENOUS | Status: DC

## 2020-06-18 MED ORDER — PROPOFOL 500 MG/50ML IV EMUL
INTRAVENOUS | Status: DC | PRN
  Administered 2020-06-18: 200 ug/kg/min via INTRAVENOUS

## 2020-06-18 NOTE — Anesthesia Preprocedure Evaluation (Signed)
Anesthesia Evaluation  Patient identified by MRN, date of birth, ID band Patient awake    Reviewed: Allergy & Precautions, H&P , NPO status , Patient's Chart, lab work & pertinent test results, reviewed documented beta blocker date and time   History of Anesthesia Complications Negative for: history of anesthetic complications  Airway Mallampati: III  TM Distance: >3 FB Neck ROM: full    Dental  (+) Dental Advidsory Given, Caps, Teeth Intact   Pulmonary neg pulmonary ROS,    Pulmonary exam normal breath sounds clear to auscultation       Cardiovascular Exercise Tolerance: Good hypertension, (-) angina(-) Past MI and (-) Cardiac Stents Normal cardiovascular exam(-) dysrhythmias (-) Valvular Problems/Murmurs Rhythm:regular Rate:Normal     Neuro/Psych negative neurological ROS  negative psych ROS   GI/Hepatic negative GI ROS, NAFLD   Endo/Other  diabetes  Renal/GU negative Renal ROS  negative genitourinary   Musculoskeletal   Abdominal   Peds  Hematology negative hematology ROS (+)   Anesthesia Other Findings Past Medical History: No date: Allergic rhinitis No date: Diabetes type 2, controlled ([place]) No date: Family history of colon cancer No date: Hepatic steatosis No date: Hypercalcemia No date: Hypertension No date: Hypertriglyceridemia No date: Recurrent pancreatitis No date: Vitamin D deficiency   Reproductive/Obstetrics negative OB ROS                             Anesthesia Physical Anesthesia Plan  ASA: II  Anesthesia Plan: General   Post-op Pain Management:    Induction: Intravenous  PONV Risk Score and Plan: 2 and TIVA and Propofol infusion  Airway Management Planned: Natural Airway and Nasal Cannula  Additional Equipment:   Intra-op Plan:   Post-operative Plan:   Informed Consent: I have reviewed the patients History and Physical, chart, labs and discussed  the procedure including the risks, benefits and alternatives for the proposed anesthesia with the patient or authorized representative who has indicated his/her understanding and acceptance.     Dental Advisory Given  Plan Discussed with: Anesthesiologist, CRNA and Surgeon  Anesthesia Plan Comments:         Anesthesia Quick Evaluation

## 2020-06-18 NOTE — H&P (Signed)
Vonda Antigua, MD [address] [place], [place], [place], [address] [address], [place], [place], [place], [address] Phone: [phone]  Fax: [phone]  Primary Care Physician:  Casilda Carls, MD   Pre-Procedure History & Physical: HPI:  Gregory Bowman is a 50 y.o. male is here for a colonoscopy.   Past Medical History:  Diagnosis Date  . Allergic rhinitis   . Diabetes type 2, controlled ([place])   . Family history of colon cancer   . Hepatic steatosis   . Hypercalcemia   . Hypertension   . Hypertriglyceridemia   . Recurrent pancreatitis   . Vitamin D deficiency     Past Surgical History:  Procedure Laterality Date  . LAPAROSCOPIC CHOLECYSTECTOMY  05/09/14   Dr. Burt Knack [facility]    Prior to Admission medications   Medication Sig Start Date End Date Taking? Authorizing Provider  atorvastatin (LIPITOR) 10 MG tablet  10/10/14  Yes [provider]  Cinnamon 500 MG TABS Take by mouth.   Yes [provider]  insulin aspart (NOVOLOG) 100 UNIT/ML FlexPen Inject into the skin.   Yes [provider]  Insulin Glargine 300 UNIT/ML SOPN Inject into the skin.   Yes [provider]  lisinopril (PRINIVIL,ZESTRIL) 5 MG tablet Take 10 mg by mouth daily.    Yes [provider]  metFORMIN (GLUCOPHAGE) 1000 MG tablet Take by mouth.   Yes [provider]  metoprolol tartrate (LOPRESSOR) 25 MG tablet Take by mouth.   Yes [provider]  B Complex-C-Folic Acid (B COMPLEX + C TR PO) Take by mouth. Patient not taking: Reported on 06/18/2020    [provider]  gemfibrozil (LOPID) 600 MG tablet Take by mouth. Patient not taking: Reported on 06/18/2020    [provider]  omega-3 acid ethyl esters (LOVAZA) 1 G capsule  10/10/14   [provider]  Omega-3 Fatty Acids (FISH OIL TRIPLE STRENGTH) 1400 MG CAPS Take by mouth.    [provider]  Vitamin D, Ergocalciferol, (DRISDOL) 50000 UNITS  CAPS capsule  10/10/14   [provider]    Allergies as of 06/07/2020  . (No Known Allergies)    Family History  Problem Relation Age of Onset  . Diabetes Mother   . Lymphoma Mother   . COPD Mother   . Stroke Father   . COPD Father   . Colon cancer Father     Social History   Socioeconomic History  . Marital status: Single    Spouse name: Not on file  . Number of children: Not on file  . Years of education: Not on file  . Highest education level: Not on file  Occupational History  . Not on file  Tobacco Use  . Smoking status: Never Smoker  . Smokeless tobacco: Never Used  Vaping Use  . Vaping Use: Never used  Substance and Sexual Activity  . Alcohol use: Yes    Alcohol/week: 0.0 standard drinks    Comment: occassionally   . Drug use: No  . Sexual activity: Not on file  Other Topics Concern  . Not on file  Social History Narrative  . Not on file   Social Determinants of Health   Financial Resource Strain: Not on file  Food Insecurity: Not on file  Transportation Needs: Not on file  Physical Activity: Not on file  Stress: Not on file  Social Connections: Not on file  Intimate Partner Violence: Not on file    Review  of Systems: See HPI, otherwise negative ROS  Physical Exam: BP (!) 150/89   Pulse 72   Temp (!) 96.5 F (35.8 C) (Temporal)   Resp 18   Ht 6\' 2"  (1.88 m)   Wt 105.2 kg   SpO2 100%   BMI 29.79 kg/m  General:   Alert,  pleasant and cooperative in NAD Head:  Normocephalic and atraumatic. Neck:  Supple; no masses or thyromegaly. Lungs:  Clear throughout to auscultation, normal respiratory effort.    Heart:  +S1, +S2, Regular rate and rhythm, No edema. Abdomen:  Soft, nontender and nondistended. Normal bowel sounds, without guarding, and without rebound.   Neurologic:  Alert and  oriented x4;  grossly normal neurologically.  Impression/Plan: Gregory Bowman is here for a colonoscopy to be performed for family history of colon  cancer.  Risks, benefits, limitations, and alternatives regarding  colonoscopy have been reviewed with the patient.  Questions have been answered.  All parties agreeable.   Virgel Manifold, MD  06/18/2020, 9:06 AM

## 2020-06-18 NOTE — Anesthesia Procedure Notes (Signed)
Performed by: Demetrius Charity, CRNA Pre-anesthesia Checklist: Suction available, Emergency Drugs available, Patient identified, Patient being monitored and Timeout performed Oxygen Delivery Method: Nasal cannula Placement Confirmation: CO2 detector and positive ETCO2

## 2020-06-18 NOTE — Anesthesia Postprocedure Evaluation (Signed)
Anesthesia Post Note  Patient: Gregory Bowman  Procedure(s) Performed: COLONOSCOPY WITH PROPOFOL (N/A )  Patient location during evaluation: Endoscopy Anesthesia Type: General Level of consciousness: awake and alert Pain management: pain level controlled Vital Signs Assessment: post-procedure vital signs reviewed and stable Respiratory status: spontaneous breathing, nonlabored ventilation, respiratory function stable and patient connected to nasal cannula oxygen Cardiovascular status: blood pressure returned to baseline and stable Postop Assessment: no apparent nausea or vomiting Anesthetic complications: no   No complications documented.   Last Vitals:  Vitals:   06/18/20 1020 06/18/20 1030  BP: (!) 161/109 (!) 149/83  Pulse: 66   Resp: 17   Temp:    SpO2: 100% 100%    Last Pain:  Vitals:   06/18/20 0940  TempSrc: Temporal  PainSc:                  Martha Clan

## 2020-06-18 NOTE — Transfer of Care (Signed)
Immediate Anesthesia Transfer of Care Note  Patient: Gregory Bowman  Procedure(s) Performed: COLONOSCOPY WITH PROPOFOL (N/A )  Patient Location: PACU  Anesthesia Type:General  Level of Consciousness: awake and alert   Airway & Oxygen Therapy: Patient Spontanous Breathing  Post-op Assessment: Report given to RN and Post -op Vital signs reviewed and stable  Post vital signs: Reviewed and stable  Last Vitals:  Vitals Value Taken Time  BP 126/76   Temp    Pulse 68 06/18/20 0948  Resp 18 06/18/20 0948  SpO2 96 % 06/18/20 0948  Vitals shown include unvalidated device data.  Last Pain:  Vitals:   06/18/20 0838  TempSrc: Temporal  PainSc: 0-No pain         Complications: No complications documented.

## 2020-06-18 NOTE — Op Note (Signed)
[facility] Gastroenterology Patient Name: Gregory Bowman Procedure Date: 06/18/2020 9:00 AM MRN: [id number] Account #: [id number] Date of Birth: 1970-06-20 Admit Type: Outpatient Age: 50 Room: [facility] ENDO ROOM 3 Gender: Male Note Status: Finalized Procedure:             Colonoscopy Indications:           Screening in patient at increased risk: Family history                         of 1st-degree relative with colorectal cancer Providers:             Teon Hudnall B. Bonna Gains MD, MD Referring MD:          Casilda Carls, MD (Referring MD) Medicines:             Monitored Anesthesia Care Complications:         No immediate complications. Procedure:             Pre-Anesthesia Assessment:                        - ASA Grade Assessment: II - A patient with mild                         systemic disease.                        - Prior to the procedure, a History and Physical was                         performed, and patient medications, allergies and                         sensitivities were reviewed. The patient's tolerance                         of previous anesthesia was reviewed.                        - The risks and benefits of the procedure and the                         sedation options and risks were discussed with the                         patient. All questions were answered and informed                         consent was obtained.                        - Patient identification and proposed procedure were                         verified prior to the procedure by the physician, the                         nurse, the anesthesiologist, the anesthetist and the  technician. The procedure was verified in the                         procedure room.                        After obtaining informed consent, the colonoscope was                         passed under direct vision. Throughout the procedure,                         the patient's blood  pressure, pulse, and oxygen                         saturations were monitored continuously. The                         Colonoscope was introduced through the anus and                         advanced to the the cecum, identified by appendiceal                         orifice and ileocecal valve. The colonoscopy was                         performed with ease. The patient tolerated the                         procedure well. The quality of the bowel preparation                         was fair. Findings:      The perianal and digital rectal examinations were normal.      A patchy area of mildly erythematous mucosa was found in the cecum.       Biopsies were taken with a cold forceps for histology.      Three flat and sessile polyps were found in the transverse colon and       ascending colon. The polyps were 5 to 10 mm in size. These polyps were       removed with a cold snare. Resection and retrieval were complete.      A 5 mm polyp was found in the sigmoid colon. The polyp was flat. The       polyp was removed with a cold snare. Resection and retrieval were       complete.      The exam was otherwise without abnormality.      The rectum, sigmoid colon, descending colon, transverse colon, ascending       colon and cecum appeared normal.      Anal papilla(e) were hypertrophied.      No additional abnormalities were found on retroflexion. Impression:            - Preparation of the colon was fair.                        - Erythematous mucosa in the cecum. Biopsied.                        -  Three 5 to 10 mm polyps in the transverse colon and                         in the ascending colon, removed with a cold snare.                         Resected and retrieved.                        - One 5 mm polyp in the sigmoid colon, removed with a                         cold snare. Resected and retrieved.                        - The examination was otherwise normal.                        - The  rectum, sigmoid colon, descending colon,                         transverse colon, ascending colon and cecum are normal.                        - Anal papilla(e) were hypertrophied. Recommendation:        - Discharge patient to home (with escort).                        - Advance diet as tolerated.                        - Continue present medications.                        - Await pathology results.                        - Repeat colonoscopy in 1 year, with 2 day prep,                         because the bowel preparation was suboptimal.                        - The findings and recommendations were discussed with                         the patient.                        - The findings and recommendations were discussed with                         the patient's family.                        - Return to primary care physician as previously                         scheduled. Procedure Code(s):     --- Professional ---  45385, Colonoscopy, flexible; with removal of                         tumor(s), polyp(s), or other lesion(s) by snare                         technique                        45380, 42, Colonoscopy, flexible; with biopsy, single                         or multiple Diagnosis Code(s):     --- Professional ---                        K63.5, Polyp of colon                        Z80.0, Family history of malignant neoplasm of                         digestive organs                        K63.89, Other specified diseases of intestine CPT copyright 2019 American Medical Association. All rights reserved. The codes documented in this report are preliminary and upon coder review may  be revised to meet current compliance requirements.  Vonda Antigua, MD Margretta Sidle B. Bonna Gains MD, MD 06/18/2020 9:49:17 AM This report has been signed electronically. Number of Addenda: 0 Note Initiated On: 06/18/2020 9:00 AM Scope Withdrawal Time: 0 hours 22 minutes 48  seconds  Total Procedure Duration: 0 hours 26 minutes 45 seconds  Estimated Blood Loss:  Estimated blood loss: none.      [facility]

## 2020-06-19 ENCOUNTER — Encounter: Payer: Self-pay | Admitting: Gastroenterology

## 2020-06-19 LAB — SURGICAL PATHOLOGY

## 2020-07-15 ENCOUNTER — Other Ambulatory Visit: Payer: Self-pay

## 2020-07-15 ENCOUNTER — Telehealth: Payer: Self-pay

## 2020-07-15 ENCOUNTER — Encounter: Payer: Self-pay | Admitting: Gastroenterology

## 2020-07-15 ENCOUNTER — Ambulatory Visit: Payer: BC Managed Care – PPO | Admitting: Gastroenterology

## 2020-07-15 VITALS — BP 136/83 | HR 88 | Temp 98.6°F | Wt 246.6 lb

## 2020-07-15 DIAGNOSIS — K760 Fatty (change of) liver, not elsewhere classified: Secondary | ICD-10-CM

## 2020-07-15 DIAGNOSIS — K639 Disease of intestine, unspecified: Secondary | ICD-10-CM

## 2020-07-15 NOTE — Progress Notes (Signed)
Vonda Antigua, MD [address]  [place]  [place], [place] [address]  Main: [phone]  Fax: [phone]   Primary Care Physician: Casilda Carls, MD   Chief complaint: Colonoscopy follow-up  HPI: Gregory Bowman is a 50 y.o. male who underwent screening colonoscopy and is here for follow-up to discuss abnormal findings on the procedure.  An area of erythema was seen in the colon and was biopsied and showed chronic colitis.  4 polyps were seen and removed, largest being 10 mm in size.  Hypertrophied anal papillae were seen.  Polyps showed sessile serrated polyp and tubular adenoma.  Repeat was recommended in 1 year with 2-day prep due to fair prep on exam.  Patient denies any family history of colon cancer.  The patient denies abdominal or flank pain, anorexia, nausea or vomiting, dysphagia, change in bowel habits or black or bloody stools or weight loss.   Patient has previous records in care everywhere that showed the patient had fatty liver.  These records were personally reviewed.  A 2016 note states:  Pancreatitis, Acute - Note for "Acute pancreatitis": Pt here for followup recurrent pancreatitis. He also has hepatic steatosis & elevated LFTS. Patient states his epigastric pain, nausea & vomiting has now resolved. 1st episode of pancreatitis was in 2014 felt to possibly be due to hypertriglyceridemia. He was seen in the ER in february 2016 & diagnosed with pancreatitis. ANA, IgG-4 normal. Triglycerides 388. No ETOH. He does have poorly controlled DM. No jaundice. His LFTS increased to ALT 205 & AST 151 during acute pain. His bilirubin was normal. His lipase was 710. Iron studies/ferritin normal. Labs negative for chronic Hep B/C. 03/13/14 ABD US-multiple cholelithiasis, CBD 5.71mm, ectatic enlargement of echodense liver (fatty infiltration vs. hepatocellular disease). 04/02/14 Pancreatic protocol CT A/P shows normal pancreas & severe hepatic steatosis.  Current Outpatient  Medications  Medication Sig Dispense Refill   atorvastatin (LIPITOR) 10 MG tablet   0   B Complex-C-Folic Acid (B COMPLEX + C TR PO) Take by mouth.     Cinnamon 500 MG TABS Take by mouth.     gemfibrozil (LOPID) 600 MG tablet Take by mouth.     insulin aspart (NOVOLOG) 100 UNIT/ML FlexPen Inject into the skin.     Insulin Glargine 300 UNIT/ML SOPN Inject into the skin.     lisinopril (PRINIVIL,ZESTRIL) 5 MG tablet Take 10 mg by mouth daily.      metFORMIN (GLUCOPHAGE) 1000 MG tablet Take by mouth.     metoprolol tartrate (LOPRESSOR) 25 MG tablet Take by mouth.     omega-3 acid ethyl esters (LOVAZA) 1 G capsule   0   Omega-3 Fatty Acids (FISH OIL TRIPLE STRENGTH) 1400 MG CAPS Take by mouth.     amLODipine (NORVASC) 5 MG tablet Take 10 mg by mouth daily.     No current facility-administered medications for this visit.    Allergies as of 07/15/2020   (No Known Allergies)    ROS: All ROS reviewed a negative except as per HPI   Physical Examination:   BP 136/83   Pulse 88   Temp 98.6 F (37 C) (Oral)   Wt 246 lb 9.6 oz (111.9 kg)   BMI 31.66 kg/m   Constitutional: General:   Alert,  Well-developed, well-nourished, pleasant and cooperative in NAD BP 136/83   Pulse 88   Temp 98.6 F (37 C) (Oral)   Wt 246 lb 9.6 oz (111.9 kg)   BMI 31.66 kg/m  Eyes:  Sclera clear, no icterus.   Conjunctiva pink. PERRLA  Ears:  No scars, lesions or masses, Normal auditory acuity. Nose:  No deformity, discharge, or lesions. Mouth:  No deformity or lesions, oropharynx pink & moist.  Neck:  Supple; no masses or thyromegaly.  Respiratory: Normal respiratory effort, Normal percussion  Gastrointestinal:  Normal bowel sounds.  No bruits.  Soft, non-tender and non-distended without masses, hepatosplenomegaly or hernias noted.  No guarding or rebound tenderness.     Cardiac: No clubbing or edema.  No cyanosis. Normal posterior tibial pedal pulses noted.  Lymphatic:  No significant cervical  or axillary adenopathy.  Psych:  Alert and cooperative. Normal mood and affect.  Musculoskeletal:  Normal gait. Head normocephalic, atraumatic. Symmetrical without gross deformities. 5/5 Upper and Lower extremity strength bilaterally.  Skin: Warm. Intact without significant lesions or rashes. No jaundice.  Neurologic:  Face symmetrical, tongue midline, Normal sensation to touch;  grossly normal neurologically.  Psych:  Alert and oriented x3, Alert and cooperative. Normal mood and affect.  Labs: CMP     Component Value Date/Time   NA 135 05/02/2014 0913   K 3.7 05/02/2014 0913   CL 99 (L) 05/02/2014 0913   CO2 24 05/02/2014 0913   GLUCOSE 224 (H) 05/02/2014 0913   BUN 15 05/02/2014 0913   CREATININE 0.74 05/02/2014 0913   CALCIUM 9.5 05/02/2014 0913   PROT 7.9 05/23/2014 1450   ALBUMIN 4.2 05/23/2014 1450   AST 99 (H) 05/23/2014 1450   ALT 127 (H) 05/23/2014 1450   ALKPHOS 67 05/23/2014 1450   BILITOT 0.6 05/23/2014 1450   GFRNONAA >60 05/02/2014 0913   GFRAA >60 05/02/2014 0913   Lab Results  Component Value Date   WBC 5.7 05/02/2014   HGB 14.2 05/02/2014   HCT 41.4 05/02/2014   MCV 85 05/02/2014   PLT 296 05/02/2014   2017 scan labs showed elevated AST ALT as well  Imaging Studies:  CT scan February 2016 showed severe hepatic steatosis 2018 ultrasound showed steatotic liver, with no focal hepatic lesions done at [facility] 2019 CT scan showed hepatic steatosis, sequela of prior cholecystectomy  Assessment and Plan:   Gregory Bowman is a 50 y.o. y/o male here for follow-up to discuss colon Erythema and chronic colitis noted on pathology  Patient is completely asymptomatic and does not have any signs or symptoms that would be expected of chronic colitis or even acute medical colitis for that matter.  Therefore, the small area of erythema seen in the colon that showed chronic colitis, is nonspecific as best.  Patient does not take any frequent NSAIDs either are  occasional for that matter.  We did discuss NSAID use to be associated with GI ulcers or irritation and he was advised to avoid excessive use in the future and he verbalized understanding  If he develops any new symptoms from now until his future colonoscopy that is recommended in 1 year, I have advised him to give [place] a call back and he verbalized understanding  Otherwise he is agreeable for repeat colonoscopy in 1 year with 2-day prep  Given that patient had elevated liver enzymes (labs and ultrasound in Care Everywhere reviewed, CT scan in our system reviewed) and he has not had repeat labs since 2017 that I have access to, will repeat labs, and possibly ultrasound as well for fatty liver  Follow-up in clinic as well for the same  Finding of fatty liver on imaging discussed with patient Diet, weight loss,  and exercise encouraged along with avoiding hepatotoxic drugs including alcohol Risk of progression to cirrhosis if above measures are not instituted were discussed as well, and patient verbalized understanding    Dr Vonda Antigua

## 2020-07-15 NOTE — Telephone Encounter (Signed)
Per Dr Bonna Gains,  Threasa Beards I was able to get some records for Gregory Bowman from care everywhere. I would like to have him get some labs and ruq u/s for fatty liver. but I have left him a voicemail so I can discuss this with him. I will call him again tomorrow and go over this with him

## 2021-06-16 ENCOUNTER — Telehealth: Payer: Self-pay | Admitting: Gastroenterology

## 2021-06-16 NOTE — Telephone Encounter (Signed)
Patient needs a repeat colonoscopy from 1 year ago. Needs a colonoscopy scheduled. Refer to notes from last office visit with DR T.  ?

## 2021-06-17 ENCOUNTER — Telehealth: Payer: Self-pay

## 2021-06-17 NOTE — Telephone Encounter (Signed)
Patients call has been returned to schedule his repeat colonoscopy. LVM for him to call office back. ? ?Scheduling Note: Last colonoscopy was performed by Dr. Bonna Gains on 06/18/20.  Dr. Bonna Gains recommended patient to have a 2 day prep repeat colonoscopy in 1 year, polyps were noted.  Patient has an office visit scheduled with Dr. Allen Norris on 09/10/21 for Erythema Colon. ?Therefore colonoscopy will be scheduled with Dr. Allen Norris. ? ?Thanks, ? ?Sharyn Lull, CMA ?

## 2021-09-10 ENCOUNTER — Ambulatory Visit (INDEPENDENT_AMBULATORY_CARE_PROVIDER_SITE_OTHER): Payer: No Typology Code available for payment source | Admitting: Gastroenterology

## 2021-09-10 ENCOUNTER — Encounter: Payer: Self-pay | Admitting: Gastroenterology

## 2021-09-10 VITALS — BP 136/90 | HR 68 | Temp 98.5°F | Ht 74.0 in | Wt 241.0 lb

## 2021-09-10 DIAGNOSIS — Z86010 Personal history of colonic polyps: Secondary | ICD-10-CM | POA: Diagnosis not present

## 2021-09-10 DIAGNOSIS — K529 Noninfective gastroenteritis and colitis, unspecified: Secondary | ICD-10-CM

## 2021-09-10 MED ORDER — NA SULFATE-K SULFATE-MG SULF 17.5-3.13-1.6 GM/177ML PO SOLN: 1.0000 | Freq: Once | ORAL | 0 refills | Status: AC

## 2021-09-10 NOTE — Addendum Note (Signed)
Addended by: Lindalou Hose Y on: 09/10/2021 06:00 PM   Modules accepted: Orders

## 2021-09-10 NOTE — Addendum Note (Signed)
Addended by: Lurlean Nanny on: 09/10/2021 10:13 AM   Modules accepted: Orders

## 2021-09-10 NOTE — Progress Notes (Signed)
Primary Care Physician: Casilda Carls, MD  Primary Gastroenterologist:  Dr. Lucilla Lame  Chief Complaint  Patient presents with   Colonic erythema    HPI: Gregory Bowman is a 51 y.o. male here for follow-up after having colonoscopy by Dr. Bonna Gains in May 2022.  It was reported that the patient's preparation for the colonoscopy was fair and a repeat colonoscopy was recommended in 1 year.  The patient did have a report of erythema seen in the cecum that was biopsied and polyps that were removed that were serrated polyps.  The biopsies showed:  DIAGNOSIS:  A. COLON ERYTHEMA, CECUM; COLD BIOPSY:  - CHRONIC COLITIS WITH PATCHY MILD ACTIVITY.  - SEE COMMENT.   Comment:  The findings are nonspecific.  Diagnostic considerations include  infectious / self-limited colitis, medication / drug effects, and  inflammatory bowel disease.  Clinical correlation is recommended.  There  is no evidence of dysplasia or malignancy.   B. COLON POLYP X3, ASCENDING AND TRANSVERSE; COLD SNARE:  - SESSILE SERRATED POLYP, ONE FRAGMENT.  - TUBULAR ADENOMA, TWO FRAGMENTS.  - NEGATIVE FOR HIGH-GRADE DYSPLASIA AND MALIGNANCY.   The patient had the procedure visually done for screening with a first-degree relative with colon cancer. The patient denies any abdominal pain nausea vomiting fevers chills black stools or bloody stools.  He also denies any unexplained weight loss.  Past Medical History:  Diagnosis Date   Allergic rhinitis    Diabetes type 2, controlled ([place])    Family history of colon cancer    Hepatic steatosis    Hypercalcemia    Hypertension    Hypertriglyceridemia    Recurrent pancreatitis    Vitamin D deficiency     Current Outpatient Medications  Medication Sig Dispense Refill   amLODipine (NORVASC) 5 MG tablet Take 10 mg by mouth daily.     atorvastatin (LIPITOR) 10 MG tablet   0   B Complex-C-Folic Acid (B COMPLEX + C TR PO) Take by mouth.     Cinnamon 500 MG TABS Take by  mouth.     gemfibrozil (LOPID) 600 MG tablet Take by mouth.     insulin aspart (NOVOLOG) 100 UNIT/ML FlexPen Inject into the skin.     Insulin Glargine 300 UNIT/ML SOPN Inject into the skin.     lisinopril (PRINIVIL,ZESTRIL) 5 MG tablet Take 10 mg by mouth daily.      metFORMIN (GLUCOPHAGE) 1000 MG tablet Take by mouth.     metoprolol tartrate (LOPRESSOR) 25 MG tablet Take by mouth.     omega-3 acid ethyl esters (LOVAZA) 1 G capsule   0   Omega-3 Fatty Acids (FISH OIL TRIPLE STRENGTH) 1400 MG CAPS Take by mouth.     No current facility-administered medications for this visit.    Allergies as of 09/10/2021   (No Known Allergies)    ROS:  General: Negative for anorexia, weight loss, fever, chills, fatigue, weakness. ENT: Negative for hoarseness, difficulty swallowing , nasal congestion. CV: Negative for chest pain, angina, palpitations, dyspnea on exertion, peripheral edema.  Respiratory: Negative for dyspnea at rest, dyspnea on exertion, cough, sputum, wheezing.  GI: See history of present illness. GU:  Negative for dysuria, hematuria, urinary incontinence, urinary frequency, nocturnal urination.  Endo: Negative for unusual weight change.    Physical Examination:   There were no vitals taken for this visit.  General: Well-nourished, well-developed in no acute distress.  Eyes: No icterus. Conjunctivae pink. Lungs: Clear to auscultation bilaterally. Non-labored. Heart: Regular rate  and rhythm, no murmurs rubs or gallops.  Abdomen: Bowel sounds are normal, nontender, nondistended, no hepatosplenomegaly or masses, no abdominal bruits or hernia , no rebound or guarding.   Extremities: No lower extremity edema. No clubbing or deformities. Neuro: Alert and oriented x 3.  Grossly intact. Skin: Warm and dry, no jaundice.   Psych: Alert and cooperative, normal mood and affect.  Labs:    Imaging Studies: No results found.  Assessment and Plan:   Gregory Bowman is a 51 y.o. y/o  male who comes in with a history of erythema in the cecum with biopsies showing nonspecific chronic colitis.  The patient has no symptoms of colitis.  The patient had a poor prep at his last colonoscopy that is why he was recommended to have a repeat colonoscopy.  The patient had 3 sessile serrated polyps at his last colonoscopy.  The patient will be set up for repeat colonoscopy at the next available appointment.  The patient has been explained the plan agrees with it.     Lucilla Lame, MD. Marval Regal    Note: This dictation was prepared with Dragon dictation along with smaller phrase technology. Any transcriptional errors that result from this process are unintentional.

## 2021-09-10 NOTE — Addendum Note (Signed)
Addended by: Lurlean Nanny on: 09/10/2021 09:49 AM   Modules accepted: Orders

## 2021-09-26 ENCOUNTER — Encounter: Payer: Self-pay | Admitting: Gastroenterology

## 2021-09-26 ENCOUNTER — Other Ambulatory Visit: Payer: Self-pay

## 2021-10-10 ENCOUNTER — Encounter
Admission: RE | Disposition: A | Discharge status: Home / Self Care | Payer: Self-pay | Source: Home / Self Care | Attending: Gastroenterology

## 2021-10-10 ENCOUNTER — Ambulatory Visit
Admission: RE | Admit: 2021-10-10 | Discharge: 2021-10-10 | Disposition: A | Discharge status: Home / Self Care | Payer: No Typology Code available for payment source | Attending: Gastroenterology | Admitting: Gastroenterology

## 2021-10-10 ENCOUNTER — Encounter: Payer: Self-pay | Admitting: Gastroenterology

## 2021-10-10 ENCOUNTER — Other Ambulatory Visit: Payer: Self-pay

## 2021-10-10 ENCOUNTER — Ambulatory Visit: Payer: No Typology Code available for payment source | Admitting: General Practice

## 2021-10-10 ENCOUNTER — Ambulatory Visit (AMBULATORY_SURGERY_CENTER): Payer: No Typology Code available for payment source | Admitting: General Practice

## 2021-10-10 DIAGNOSIS — Z7984 Long term (current) use of oral hypoglycemic drugs: Secondary | ICD-10-CM | POA: Diagnosis not present

## 2021-10-10 DIAGNOSIS — K640 First degree hemorrhoids: Secondary | ICD-10-CM

## 2021-10-10 DIAGNOSIS — Z860100 Personal history of colon polyps, unspecified: Secondary | ICD-10-CM

## 2021-10-10 DIAGNOSIS — E119 Type 2 diabetes mellitus without complications: Secondary | ICD-10-CM | POA: Diagnosis not present

## 2021-10-10 DIAGNOSIS — Z794 Long term (current) use of insulin: Secondary | ICD-10-CM | POA: Insufficient documentation

## 2021-10-10 DIAGNOSIS — Z800 Family history of malignant neoplasm of digestive organs: Secondary | ICD-10-CM | POA: Insufficient documentation

## 2021-10-10 DIAGNOSIS — Z09 Encounter for follow-up examination after completed treatment for conditions other than malignant neoplasm: Secondary | ICD-10-CM | POA: Diagnosis present

## 2021-10-10 DIAGNOSIS — K5730 Diverticulosis of large intestine without perforation or abscess without bleeding: Secondary | ICD-10-CM | POA: Diagnosis not present

## 2021-10-10 DIAGNOSIS — D124 Benign neoplasm of descending colon: Secondary | ICD-10-CM | POA: Insufficient documentation

## 2021-10-10 DIAGNOSIS — K760 Fatty (change of) liver, not elsewhere classified: Secondary | ICD-10-CM | POA: Insufficient documentation

## 2021-10-10 DIAGNOSIS — K635 Polyp of colon: Secondary | ICD-10-CM

## 2021-10-10 DIAGNOSIS — D125 Benign neoplasm of sigmoid colon: Secondary | ICD-10-CM | POA: Diagnosis not present

## 2021-10-10 DIAGNOSIS — I10 Essential (primary) hypertension: Secondary | ICD-10-CM | POA: Diagnosis not present

## 2021-10-10 DIAGNOSIS — E781 Pure hyperglyceridemia: Secondary | ICD-10-CM | POA: Diagnosis not present

## 2021-10-10 HISTORY — PX: COLONOSCOPY: SHX5424

## 2021-10-10 HISTORY — PX: POLYPECTOMY: SHX5525

## 2021-10-10 LAB — GLUCOSE, CAPILLARY
Glucose-Capillary: 116 mg/dL — ABNORMAL HIGH (ref 70–99)
Glucose-Capillary: 116 mg/dL — ABNORMAL HIGH (ref 70–99)

## 2021-10-10 SURGERY — COLONOSCOPY
Anesthesia: General | Site: Rectum

## 2021-10-10 MED ORDER — STERILE WATER FOR IRRIGATION IR SOLN
Status: DC | PRN
  Administered 2021-10-10: 150 mL

## 2021-10-10 MED ORDER — SODIUM CHLORIDE 0.9 % IV SOLN: INTRAVENOUS | Status: DC

## 2021-10-10 MED ORDER — EPHEDRINE SULFATE (PRESSORS) 50 MG/ML IJ SOLN
INTRAMUSCULAR | Status: DC | PRN
  Administered 2021-10-10 (×2): 10 mg via INTRAVENOUS

## 2021-10-10 MED ORDER — LIDOCAINE HCL (CARDIAC) PF 100 MG/5ML IV SOSY
PREFILLED_SYRINGE | INTRAVENOUS | Status: DC | PRN
  Administered 2021-10-10: 100 mg via INTRAVENOUS

## 2021-10-10 MED ORDER — PROPOFOL 10 MG/ML IV BOLUS
INTRAVENOUS | Status: DC | PRN
  Administered 2021-10-10 (×2): 50 mg via INTRAVENOUS
  Administered 2021-10-10: 100 mg via INTRAVENOUS
  Administered 2021-10-10 (×3): 50 mg via INTRAVENOUS

## 2021-10-10 MED ORDER — STERILE WATER FOR IRRIGATION IR SOLN
Status: DC | PRN
  Administered 2021-10-10: 120 mL

## 2021-10-10 MED ORDER — LACTATED RINGERS IV SOLN: INTRAVENOUS | Status: DC

## 2021-10-10 SURGICAL SUPPLY — 21 items

## 2021-10-10 NOTE — H&P (Signed)
Gregory Lame, MD [address] [place], [place] [address] Phone:[phone] Fax : [phone]  Primary Care Physician:  Casilda Carls, MD Primary Gastroenterologist:  Dr. Allen Norris  Pre-Procedure History & Physical: HPI:  Gregory Bowman is a 51 y.o. male is here for an colonoscopy.   Past Medical History:  Diagnosis Date   Allergic rhinitis    Diabetes type 2, controlled ([place])    Family history of colon cancer    Hepatic steatosis    Hypercalcemia    Hypertension    Hypertriglyceridemia    Recurrent pancreatitis    Vitamin D deficiency     Past Surgical History:  Procedure Laterality Date   COLONOSCOPY WITH PROPOFOL N/A 06/18/2020   Procedure: COLONOSCOPY WITH PROPOFOL;  Surgeon: Virgel Manifold, MD;  Location: [facility] ENDOSCOPY;  Service: Endoscopy;  Laterality: N/A;   LAPAROSCOPIC CHOLECYSTECTOMY  05/09/14   Dr. Burt Knack [facility]    Prior to Admission medications   Medication Sig Start Date End Date Taking? Authorizing Provider  amLODipine (NORVASC) 5 MG tablet Take 10 mg by mouth daily. 06/17/20  Yes [provider]  atorvastatin (LIPITOR) 10 MG tablet  10/10/14  Yes [provider]  B Complex-C-Folic Acid (B COMPLEX + C TR PO) Take by mouth.   Yes [provider]  Cinnamon 500 MG TABS Take by mouth.   Yes [provider]  insulin aspart (NOVOLOG) 100 UNIT/ML FlexPen Inject into the skin.   Yes [provider]  Insulin Glargine 300 UNIT/ML SOPN Inject into the skin.   Yes [provider]  lisinopril (PRINIVIL,ZESTRIL) 5 MG tablet Take 10 mg by mouth daily.    Yes [provider]  metFORMIN (GLUCOPHAGE) 1000 MG tablet Take by mouth.   Yes [provider]  metoprolol tartrate (LOPRESSOR) 25 MG tablet Take by mouth.   Yes [provider]  Omega-3 Fatty Acids (FISH OIL TRIPLE STRENGTH) 1400 MG CAPS Take by mouth.   Yes [provider]  gemfibrozil (LOPID) 600 MG tablet  Take by mouth. Patient not taking: Reported on 09/26/2021    [provider]  omega-3 acid ethyl esters (LOVAZA) 1 G capsule  10/10/14   [provider]    Allergies as of 09/10/2021   (No Known Allergies)    Family History  Problem Relation Age of Onset   Diabetes Mother    Lymphoma Mother    COPD Mother    Stroke Father    COPD Father    Colon cancer Father     Social History   Socioeconomic History   Marital status: Single    Spouse name: Not on file   Number of children: Not on file   Years of education: Not on file   Highest education level: Not on file  Occupational History   Not on file  Tobacco Use   Smoking status: Never   Smokeless tobacco: Never  Vaping Use   Vaping Use: Never used  Substance and Sexual Activity   Alcohol use: Yes    Alcohol/week: 0.0 standard drinks of alcohol    Comment: occassionally    Drug use: No   Sexual activity: Not on file  Other Topics Concern   Not on file  Social History Narrative   Not on file   Social Determinants of Health   Financial Resource Strain: Not on file  Food Insecurity: Not on file  Transportation Needs: Not on file  Physical Activity: Not on file  Stress: Not on file  Social Connections: Not on file  Intimate Partner Violence: Not on file    Review of Systems: See HPI, otherwise negative ROS  Physical Exam: BP 125/71   Pulse 67   Temp (!) 97.2 F (36.2 C) (Temporal)   Ht '6\' 2"'$  (1.88 m)   Wt 107.5 kg   SpO2 99%   BMI 30.43 kg/m  General:   Alert,  pleasant and cooperative in NAD Head:  Normocephalic and atraumatic. Neck:  Supple; no masses or thyromegaly. Lungs:  Clear throughout to auscultation.    Heart:  Regular rate and rhythm. Abdomen:  Soft, nontender and nondistended. Normal bowel sounds, without guarding, and without rebound.   Neurologic:  Alert and  oriented x4;  grossly normal neurologically.  Impression/Plan: Gregory Bowman is here for an colonoscopy to be  performed for a history of adenomatous polyps on 2022   Risks, benefits, limitations, and alternatives regarding  colonoscopy have been reviewed with the patient.  Questions have been answered.  All parties agreeable.   Gregory Lame, MD  10/10/2021, 8:29 AM

## 2021-10-10 NOTE — Op Note (Signed)
[facility] Gastroenterology Patient Name: Gregory Bowman Procedure Date: 10/10/2021 8:52 AM MRN: [id number] Account #: [id number] Date of Birth: [birth year] Admit Type: Outpatient Age: 51 Room: [facility] OR ROOM 01 Gender: Male Note Status: Finalized Instrument Name: [id number] Procedure:             Colonoscopy Indications:           High risk colon cancer surveillance: Personal history                         of colonic polyps Providers:             Lucilla Lame MD, MD Referring MD:          Casilda Carls, MD (Referring MD) Medicines:             Propofol per Anesthesia Complications:         No immediate complications. Procedure:             Pre-Anesthesia Assessment:                        - Prior to the procedure, a History and Physical was                         performed, and patient medications and allergies were                         reviewed. The patient's tolerance of previous                         anesthesia was also reviewed. The risks and benefits                         of the procedure and the sedation options and risks                         were discussed with the patient. All questions were                         answered, and informed consent was obtained. Prior                         Anticoagulants: The patient has taken no previous                         anticoagulant or antiplatelet agents. ASA Grade                         Assessment: II - A patient with mild systemic disease.                         After reviewing the risks and benefits, the patient                         was deemed in satisfactory condition to undergo the                         procedure.  After obtaining informed consent, the colonoscope was                         passed under direct vision. Throughout the procedure,                         the patient's blood pressure, pulse, and oxygen                         saturations were monitored  continuously. The                         Colonoscope was introduced through the anus and                         advanced to the the cecum, identified by appendiceal                         orifice and ileocecal valve. The colonoscopy was                         performed without difficulty. The patient tolerated                         the procedure well. The quality of the bowel                         preparation was excellent. Findings:      The perianal and digital rectal examinations were normal.      A 4 mm polyp was found in the sigmoid colon. The polyp was sessile. The       polyp was removed with a cold biopsy forceps. Resection and retrieval       were complete.      A 4 mm polyp was found in the descending colon. The polyp was sessile.       The polyp was removed with a cold biopsy forceps. Resection and       retrieval were complete.      Multiple small-mouthed diverticula were found in the sigmoid colon and       descending colon.      Non-bleeding internal hemorrhoids were found during retroflexion. The       hemorrhoids were Grade I (internal hemorrhoids that do not prolapse). Impression:            - One 4 mm polyp in the sigmoid colon, removed with a                         cold biopsy forceps. Resected and retrieved.                        - One 4 mm polyp in the descending colon, removed with                         a cold biopsy forceps. Resected and retrieved.                        - Diverticulosis in the sigmoid colon and in the  descending colon.                        - Non-bleeding internal hemorrhoids. Recommendation:        - Discharge patient to home.                        - Resume previous diet.                        - Continue present medications.                        - Await pathology results.                        - Repeat colonoscopy in 5 years for surveillance. Procedure Code(s):     --- Professional ---                         [id number], Colonoscopy, flexible; with biopsy, single or                         multiple Diagnosis Code(s):     --- Professional ---                        Z86.010, Personal history of colonic polyps                        K63.5, Polyp of colon CPT copyright 2019 American Medical Association. All rights reserved. The codes documented in this report are preliminary and upon coder review may  be revised to meet current compliance requirements. Lucilla Lame MD, MD 10/10/2021 9:25:54 AM This report has been signed electronically. Number of Addenda: 0 Note Initiated On: 10/10/2021 8:52 AM Scope Withdrawal Time: 0 hours 16 minutes 19 seconds  Total Procedure Duration: 0 hours 19 minutes 16 seconds  Estimated Blood Loss:  Estimated blood loss: none.      [facility]

## 2021-10-10 NOTE — Anesthesia Preprocedure Evaluation (Signed)
Anesthesia Evaluation  Patient identified by MRN, date of birth, ID band Patient awake    Reviewed: Allergy & Precautions, H&P , NPO status , Patient's Chart, lab work & pertinent test results, reviewed documented beta blocker date and time   History of Anesthesia Complications Negative for: history of anesthetic complications  Airway Mallampati: III  TM Distance: >3 FB Neck ROM: full    Dental  (+) Dental Advidsory Given, Caps, Teeth Intact   Pulmonary neg pulmonary ROS,    Pulmonary exam normal breath sounds clear to auscultation       Cardiovascular Exercise Tolerance: Good hypertension, (-) angina(-) Past MI and (-) Cardiac Stents Normal cardiovascular exam(-) dysrhythmias (-) Valvular Problems/Murmurs Rhythm:regular Rate:Normal     Neuro/Psych negative neurological ROS  negative psych ROS   GI/Hepatic negative GI ROS, NAFLD   Endo/Other  diabetes  Renal/GU negative Renal ROS  negative genitourinary   Musculoskeletal   Abdominal   Peds  Hematology negative hematology ROS (+)   Anesthesia Other Findings Past Medical History: No date: Allergic rhinitis No date: Diabetes type 2, controlled ([place]) No date: Family history of colon cancer No date: Hepatic steatosis No date: Hypercalcemia No date: Hypertension No date: Hypertriglyceridemia No date: Recurrent pancreatitis No date: Vitamin D deficiency   Reproductive/Obstetrics negative OB ROS                             Anesthesia Physical  Anesthesia Plan  ASA: 2  Anesthesia Plan: General   Post-op Pain Management:    Induction: Intravenous  PONV Risk Score and Plan: 2 and TIVA and Propofol infusion  Airway Management Planned: Natural Airway and Nasal Cannula  Additional Equipment:   Intra-op Plan:   Post-operative Plan:   Informed Consent: I have reviewed the patients History and Physical, chart, labs and discussed  the procedure including the risks, benefits and alternatives for the proposed anesthesia with the patient or authorized representative who has indicated his/her understanding and acceptance.     Dental Advisory Given  Plan Discussed with: Anesthesiologist, CRNA and Surgeon  Anesthesia Plan Comments:         Anesthesia Quick Evaluation

## 2021-10-10 NOTE — Transfer of Care (Signed)
Immediate Anesthesia Transfer of Care Note  Patient: Gregory Bowman  Procedure(s) Performed: COLONOSCOPY (Rectum) POLYPECTOMY (Rectum)  Patient Location: PACU  Anesthesia Type: General  Level of Consciousness: awake, alert  and patient cooperative  Airway and Oxygen Therapy: Patient Spontanous Breathing and Patient connected to supplemental oxygen  Post-op Assessment: Post-op Vital signs reviewed, Patient's Cardiovascular Status Stable, Respiratory Function Stable, Patent Airway and No signs of Nausea or vomiting  Post-op Vital Signs: Reviewed and stable  Complications: There were no known notable events for this encounter.

## 2021-10-10 NOTE — Anesthesia Postprocedure Evaluation (Signed)
Anesthesia Post Note  Patient: Gregory Bowman  Procedure(s) Performed: COLONOSCOPY (Rectum) POLYPECTOMY (Rectum)     Patient location during evaluation: PACU Anesthesia Type: General Level of consciousness: awake and alert Pain management: pain level controlled Vital Signs Assessment: post-procedure vital signs reviewed and stable Respiratory status: spontaneous breathing, nonlabored ventilation, respiratory function stable and patient connected to nasal cannula oxygen Cardiovascular status: blood pressure returned to baseline and stable Postop Assessment: no apparent nausea or vomiting Anesthetic complications: no   There were no known notable events for this encounter.  Martha Clan

## 2021-10-13 ENCOUNTER — Encounter: Payer: Self-pay | Admitting: Gastroenterology

## 2021-10-14 LAB — SURGICAL PATHOLOGY

## 2021-10-15 ENCOUNTER — Encounter: Payer: Self-pay | Admitting: Gastroenterology

## 2022-07-06 ENCOUNTER — Other Ambulatory Visit: Payer: Self-pay | Admitting: Internal Medicine

## 2022-07-06 DIAGNOSIS — R0609 Other forms of dyspnea: Secondary | ICD-10-CM

## 2022-07-15 ENCOUNTER — Ambulatory Visit (INDEPENDENT_AMBULATORY_CARE_PROVIDER_SITE_OTHER): Payer: No Typology Code available for payment source

## 2022-07-15 DIAGNOSIS — R0609 Other forms of dyspnea: Secondary | ICD-10-CM

## 2022-07-15 MED ORDER — TECHNETIUM TC 99M SESTAMIBI GENERIC - CARDIOLITE
10.5000 | Freq: Once | INTRAVENOUS | Status: AC | PRN
  Administered 2022-07-15: 10.5 via INTRAVENOUS

## 2022-07-15 MED ORDER — TECHNETIUM TC 99M SESTAMIBI GENERIC - CARDIOLITE: 10.5000 | Freq: Once | INTRAVENOUS | Status: DC | PRN

## 2022-07-15 MED ORDER — TECHNETIUM TC 99M SESTAMIBI GENERIC - CARDIOLITE
33.0000 | Freq: Once | INTRAVENOUS | Status: AC | PRN
  Administered 2022-07-15: 33 via INTRAVENOUS

## 2022-08-07 ENCOUNTER — Other Ambulatory Visit: Payer: Self-pay | Admitting: Internal Medicine

## 2022-08-07 DIAGNOSIS — R42 Dizziness and giddiness: Secondary | ICD-10-CM

## 2022-08-14 ENCOUNTER — Ambulatory Visit
Admission: RE | Admit: 2022-08-14 | Discharge: 2022-08-14 | Disposition: A | Discharge status: Home / Self Care | Payer: No Typology Code available for payment source | Source: Ambulatory Visit | Attending: Internal Medicine | Admitting: Internal Medicine

## 2022-08-14 DIAGNOSIS — R42 Dizziness and giddiness: Secondary | ICD-10-CM | POA: Insufficient documentation

## 2023-11-09 ENCOUNTER — Other Ambulatory Visit: Payer: Self-pay | Admitting: Cardiology

## 2023-11-09 ENCOUNTER — Encounter: Payer: Self-pay | Admitting: Cardiology

## 2023-11-09 ENCOUNTER — Ambulatory Visit: Admitting: Cardiology

## 2023-11-09 VITALS — BP 138/78 | HR 86 | Ht 74.0 in | Wt 223.8 lb

## 2023-11-09 DIAGNOSIS — E119 Type 2 diabetes mellitus without complications: Secondary | ICD-10-CM

## 2023-11-09 DIAGNOSIS — E782 Mixed hyperlipidemia: Secondary | ICD-10-CM

## 2023-11-09 DIAGNOSIS — Z7689 Persons encountering health services in other specified circumstances: Secondary | ICD-10-CM | POA: Diagnosis not present

## 2023-11-09 DIAGNOSIS — Z794 Long term (current) use of insulin: Secondary | ICD-10-CM

## 2023-11-09 DIAGNOSIS — I10 Essential (primary) hypertension: Secondary | ICD-10-CM | POA: Diagnosis not present

## 2023-11-09 DIAGNOSIS — Z1329 Encounter for screening for other suspected endocrine disorder: Secondary | ICD-10-CM

## 2023-11-09 DIAGNOSIS — Z125 Encounter for screening for malignant neoplasm of prostate: Secondary | ICD-10-CM

## 2023-11-09 MED ORDER — METFORMIN HCL 1000 MG PO TABS: 1000.0000 mg | ORAL_TABLET | Freq: Two times a day (BID) | ORAL | 1 refills | Status: AC

## 2023-11-09 MED ORDER — ATORVASTATIN CALCIUM 10 MG PO TABS: 10.0000 mg | ORAL_TABLET | Freq: Every day | ORAL | 1 refills | Status: DC

## 2023-11-09 MED ORDER — TOUJEO MAX SOLOSTAR 300 UNIT/ML ~~LOC~~ SOPN: PEN_INJECTOR | SUBCUTANEOUS | 3 refills | Status: DC

## 2023-11-09 MED ORDER — AMLODIPINE BESYLATE 10 MG PO TABS: 10.0000 mg | ORAL_TABLET | Freq: Every day | ORAL | 1 refills | Status: AC

## 2023-11-09 MED ORDER — GLIPIZIDE ER 5 MG PO TB24: 5.0000 mg | ORAL_TABLET | Freq: Every day | ORAL | 1 refills | Status: DC

## 2023-11-09 MED ORDER — METOPROLOL TARTRATE 25 MG PO TABS: 25.0000 mg | ORAL_TABLET | Freq: Two times a day (BID) | ORAL | 1 refills | Status: AC

## 2023-11-09 MED ORDER — LISINOPRIL 5 MG PO TABS: 10.0000 mg | ORAL_TABLET | Freq: Every day | ORAL | 1 refills | Status: DC

## 2023-11-09 MED ORDER — OZEMPIC (0.25 OR 0.5 MG/DOSE) 2 MG/1.5ML ~~LOC~~ SOPN: 0.5000 mg | PEN_INJECTOR | SUBCUTANEOUS | 4 refills | Status: DC

## 2023-11-09 NOTE — Progress Notes (Signed)
 New Patient Office Visit  Subjective   Patient ID: Gregory Bowman, male    DOB: [birth year]  Age: 53 y.o. MRN: [id number]  CC:  Chief Complaint  Patient presents with   Establish Care    NPE jadali pt.    HPI Gregory Bowman presents to establish care Previous Primary Care provider/office:   he does not have additional concerns to discuss today.   Patient in office to establish care. Patient has no complaints today. Patient scheduled to establish care with MD in November, needed to be seen sooner, out of Insulin and anti-hypertensive medications. Colonoscopy 10/2021, recommended to repeat in 5 years. Ophthalmology appointment in September 2024. Patient declines flu vaccination today. Patient fasting, will get lab work today.  Medications refilled.     Outpatient Encounter Medications as of 11/09/2023  Medication Sig   Cinnamon 500 MG TABS Take by mouth.   Omega-3 Fatty Acids (FISH OIL TRIPLE STRENGTH) 1400 MG CAPS Take by mouth.   [DISCONTINUED] amLODipine (NORVASC) 5 MG tablet Take 10 mg by mouth daily.   [DISCONTINUED] atorvastatin (LIPITOR) 10 MG tablet    [DISCONTINUED] glipiZIDE (GLUCOTROL XL) 5 MG 24 hr tablet Take 5 mg by mouth daily with breakfast. (Patient taking differently: Take 5 mg by mouth daily with breakfast. 2 tablets in the morning and 1 in the evening)   [DISCONTINUED] insulin glargine, 2 Unit Dial, (TOUJEO MAX SOLOSTAR) 300 UNIT/ML Solostar Pen Inject 300 Units into the skin in the morning and at bedtime.   [DISCONTINUED] lisinopril (PRINIVIL,ZESTRIL) 5 MG tablet Take 10 mg by mouth daily.    [DISCONTINUED] metFORMIN (GLUCOPHAGE) 1000 MG tablet Take by mouth.   [DISCONTINUED] metoprolol tartrate (LOPRESSOR) 25 MG tablet Take by mouth.   [DISCONTINUED] Semaglutide,0.25 or 0.5MG /DOS, (OZEMPIC, 0.25 OR 0.5 MG/DOSE,) 2 MG/1.5ML SOPN Inject 0.25 mLs into the skin once a week.   amLODipine (NORVASC) 10 MG tablet Take 1 tablet (10 mg total) by mouth daily.    atorvastatin (LIPITOR) 10 MG tablet Take 1 tablet (10 mg total) by mouth daily.   glipiZIDE (GLUCOTROL XL) 5 MG 24 hr tablet Take 1 tablet (5 mg total) by mouth daily with breakfast. 2 tablets in the morning and 1 in the evening   insulin glargine, 2 Unit Dial, (TOUJEO MAX SOLOSTAR) 300 UNIT/ML Solostar Pen Inject 30 units into the skin daily in the morning and 25 units in the evening   lisinopril (ZESTRIL) 5 MG tablet Take 2 tablets (10 mg total) by mouth daily.   metFORMIN (GLUCOPHAGE) 1000 MG tablet Take 1 tablet (1,000 mg total) by mouth 2 (two) times daily with a meal.   metoprolol tartrate (LOPRESSOR) 25 MG tablet Take 1 tablet (25 mg total) by mouth 2 (two) times daily.   Semaglutide,0.25 or 0.5MG /DOS, (OZEMPIC, 0.25 OR 0.5 MG/DOSE,) 2 MG/1.5ML SOPN Inject 0.5 mg into the skin once a week.   [DISCONTINUED] B Complex-C-Folic Acid (B COMPLEX + C TR PO) Take by mouth. (Patient not taking: Reported on 11/09/2023)   [DISCONTINUED] gemfibrozil (LOPID) 600 MG tablet Take by mouth. (Patient not taking: Reported on 11/09/2023)   [DISCONTINUED] insulin aspart (NOVOLOG) 100 UNIT/ML FlexPen Inject into the skin. (Patient not taking: Reported on 11/09/2023)   [DISCONTINUED] Insulin Glargine 300 UNIT/ML SOPN Inject into the skin. (Patient not taking: Reported on 11/09/2023)   [DISCONTINUED] omega-3 acid ethyl esters (LOVAZA) 1 G capsule  (Patient not taking: Reported on 11/09/2023)   No facility-administered encounter medications on file as of 11/09/2023.  Past Medical History:  Diagnosis Date   Allergic rhinitis    Diabetes type 2, controlled (HCC)    Family history of colon cancer    Hepatic steatosis    Hypercalcemia    Hypertension    Hypertriglyceridemia    Recurrent pancreatitis    Vitamin D deficiency     Past Surgical History:  Procedure Laterality Date   COLONOSCOPY N/A 10/10/2021   Procedure: COLONOSCOPY;  Surgeon: Jinny Carmine, MD;  Location: [facility];  Service: Endoscopy;   Laterality: N/A;   COLONOSCOPY WITH PROPOFOL  N/A 06/18/2020   Procedure: COLONOSCOPY WITH PROPOFOL ;  Surgeon: Janalyn Keene NOVAK, MD;  Location: [facility];  Service: Endoscopy;  Laterality: N/A;   LAPAROSCOPIC CHOLECYSTECTOMY  05/09/14   Dr. Wonda GLENWOOD [facility]   POLYPECTOMY  10/10/2021   Procedure: POLYPECTOMY;  Surgeon: Jinny Carmine, MD;  Location: [facility];  Service: Endoscopy;;    Family History  Problem Relation Age of Onset   Diabetes Mother    Lymphoma Mother    COPD Mother    Stroke Father    COPD Father    Colon cancer Father     Social History   Socioeconomic History   Marital status: Single    Spouse name: Not on file   Number of children: Not on file   Years of education: Not on file   Highest education level: Not on file  Occupational History   Not on file  Tobacco Use   Smoking status: Never   Smokeless tobacco: Never  Vaping Use   Vaping status: Never Used  Substance and Sexual Activity   Alcohol use: Yes    Alcohol/week: 0.0 standard drinks of alcohol    Comment: occassionally    Drug use: No   Sexual activity: Not on file  Other Topics Concern   Not on file  Social History Narrative   Not on file   Social Drivers of Health   Financial Resource Strain: Not on file  Food Insecurity: Not on file  Transportation Needs: Not on file  Physical Activity: Not on file  Stress: Not on file  Social Connections: Not on file  Intimate Partner Violence: Not on file    Review of Systems  Constitutional: Negative.   HENT: Negative.    Eyes: Negative.   Respiratory: Negative.  Negative for shortness of breath.   Cardiovascular: Negative.  Negative for chest pain.  Gastrointestinal: Negative.  Negative for abdominal pain, constipation and diarrhea.  Genitourinary: Negative.   Musculoskeletal:  Negative for joint pain and myalgias.  Skin: Negative.   Neurological: Negative.  Negative for dizziness and headaches.  Endo/Heme/Allergies:  Negative.   All other systems reviewed and are negative.       Objective   BP 138/78 (BP Location: Right Arm, Patient Position: Sitting, Cuff Size: Large)   Pulse 86   Ht 6' 2 (1.88 m)   Wt 223 lb 12.8 oz (101.5 kg)   SpO2 98%   BMI 28.73 kg/m   Physical Exam Nursing note reviewed.  Constitutional:      Appearance: Normal appearance. He is normal weight.  HENT:     Head: Normocephalic and atraumatic.     Nose: Nose normal.     Mouth/Throat:     Mouth: Mucous membranes are moist.     Pharynx: Oropharynx is clear.  Eyes:     Extraocular Movements: Extraocular movements intact.     Conjunctiva/sclera: Conjunctivae normal.     Pupils: Pupils are  equal, round, and reactive to light.  Cardiovascular:     Rate and Rhythm: Normal rate and regular rhythm.     Pulses: Normal pulses.     Heart sounds: Normal heart sounds.  Pulmonary:     Effort: Pulmonary effort is normal.     Breath sounds: Normal breath sounds.  Abdominal:     General: Abdomen is flat. Bowel sounds are normal.     Palpations: Abdomen is soft.  Musculoskeletal:        General: Normal range of motion.     Cervical back: Normal range of motion.  Skin:    General: Skin is warm and dry.  Neurological:     General: No focal deficit present.     Mental Status: He is alert and oriented to person, place, and time.  Psychiatric:        Mood and Affect: Mood normal.        Behavior: Behavior normal.        Thought Content: Thought content normal.        Judgment: Judgment normal.      Assessment & Plan:  Fasting lab work today. Restart missed medications.  Problem List Items Addressed This Visit       Cardiovascular and Mediastinum   BP (high blood pressure)   Relevant Medications   lisinopril (ZESTRIL) 5 MG tablet   amLODipine (NORVASC) 10 MG tablet   atorvastatin (LIPITOR) 10 MG tablet   metoprolol tartrate (LOPRESSOR) 25 MG tablet   Other Relevant Orders   CMP14+EGFR     Other   Encounter to  establish care - Primary   Other Visit Diagnoses       Thyroid disorder screening       Relevant Orders   TSH     Prostate cancer screening       Relevant Orders   PSA     Mixed hyperlipidemia       Relevant Medications   lisinopril (ZESTRIL) 5 MG tablet   amLODipine (NORVASC) 10 MG tablet   atorvastatin (LIPITOR) 10 MG tablet   metoprolol tartrate (LOPRESSOR) 25 MG tablet   Other Relevant Orders   Lipid Profile     Type 2 diabetes mellitus without complication, with long-term current use of insulin (HCC)       Relevant Medications   insulin glargine, 2 Unit Dial, (TOUJEO MAX SOLOSTAR) 300 UNIT/ML Solostar Pen   lisinopril (ZESTRIL) 5 MG tablet   atorvastatin (LIPITOR) 10 MG tablet   glipiZIDE (GLUCOTROL XL) 5 MG 24 hr tablet   metFORMIN (GLUCOPHAGE) 1000 MG tablet   Semaglutide,0.25 or 0.5MG /DOS, (OZEMPIC, 0.25 OR 0.5 MG/DOSE,) 2 MG/1.5ML SOPN   Other Relevant Orders   Hemoglobin A1c       Return if symptoms worsen or fail to improve, for as scheduled with  NK.   Total time spent: 25 minutes  [employer], NP  11/09/2023   This document may have been prepared by Dragon Voice Recognition software and as such may include unintentional dictation errors.

## 2023-11-10 ENCOUNTER — Ambulatory Visit: Payer: Self-pay | Admitting: Cardiology

## 2023-11-10 ENCOUNTER — Telehealth: Payer: Self-pay

## 2023-11-10 LAB — CMP14+EGFR
ALT: 45 IU/L — ABNORMAL HIGH (ref 0–44)
AST: 27 IU/L (ref 0–40)
Albumin: 4.8 g/dL (ref 3.8–4.9)
Alkaline Phosphatase: 93 IU/L (ref 47–123)
BUN/Creatinine Ratio: 21 — ABNORMAL HIGH (ref 9–20)
BUN: 22 mg/dL (ref 6–24)
Bilirubin Total: 0.5 mg/dL (ref 0.0–1.2)
CO2: 22 mmol/L (ref 20–29)
Calcium: 10.2 mg/dL (ref 8.7–10.2)
Chloride: 95 mmol/L — ABNORMAL LOW (ref 96–106)
Creatinine, Ser: 1.04 mg/dL (ref 0.76–1.27)
Globulin, Total: 2.8 g/dL (ref 1.5–4.5)
Glucose: 268 mg/dL — ABNORMAL HIGH (ref 70–99)
Potassium: 4.5 mmol/L (ref 3.5–5.2)
Sodium: 133 mmol/L — ABNORMAL LOW (ref 134–144)
Total Protein: 7.6 g/dL (ref 6.0–8.5)
eGFR: 85 mL/min/1.73 (ref >59)

## 2023-11-10 LAB — PSA: Prostate Specific Ag, Serum: 0.8 ng/mL (ref 0.0–4.0)

## 2023-11-10 LAB — LIPID PANEL
Chol/HDL Ratio: 7.7 ratio — ABNORMAL HIGH (ref 0.0–5.0)
Cholesterol, Total: 216 mg/dL — ABNORMAL HIGH (ref 100–199)
HDL: 28 mg/dL — ABNORMAL LOW (ref >39)
LDL Chol Calc (NIH): 84 mg/dL (ref 0–99)
Triglycerides: 642 mg/dL (ref 0–149)
VLDL Cholesterol Cal: 104 mg/dL — ABNORMAL HIGH (ref 5–40)

## 2023-11-10 LAB — HEMOGLOBIN A1C
Est. average glucose Bld gHb Est-mCnc: 220 mg/dL
Hgb A1c MFr Bld: 9.3 % — ABNORMAL HIGH (ref 4.8–5.6)

## 2023-11-10 LAB — TSH: TSH: 0.891 u[IU]/mL (ref 0.450–4.500)

## 2023-11-10 NOTE — Telephone Encounter (Signed)
 Pt left VM requesting a call back. Did not state why.

## 2023-11-10 NOTE — Progress Notes (Signed)
 Left VM to return call

## 2023-11-11 ENCOUNTER — Other Ambulatory Visit: Payer: Self-pay | Admitting: Cardiology

## 2023-11-11 MED ORDER — FENOFIBRATE 145 MG PO TABS
145.0000 mg | ORAL_TABLET | Freq: Every day | ORAL | 11 refills | Status: AC
Start: 2023-11-11 — End: 2024-11-10

## 2023-11-16 NOTE — Progress Notes (Signed)
Pt informed

## 2023-11-19 ENCOUNTER — Other Ambulatory Visit: Payer: Self-pay | Admitting: Cardiology

## 2023-11-19 ENCOUNTER — Telehealth: Payer: Self-pay

## 2023-11-19 MED ORDER — GLIPIZIDE ER 5 MG PO TB24: ORAL_TABLET | ORAL | 1 refills | Status: DC

## 2023-11-19 NOTE — Telephone Encounter (Signed)
 Pharmacy called asking or clarification on the glipizide that was sent in on 10/7 looks like it has two different directions on it and send the corrected rx to the pharmacy

## 2023-12-16 ENCOUNTER — Encounter: Payer: Self-pay | Admitting: Internal Medicine

## 2023-12-16 ENCOUNTER — Ambulatory Visit: Payer: Self-pay | Admitting: Internal Medicine

## 2023-12-16 ENCOUNTER — Ambulatory Visit (INDEPENDENT_AMBULATORY_CARE_PROVIDER_SITE_OTHER): Admitting: Internal Medicine

## 2023-12-16 VITALS — BP 174/96 | HR 66 | Ht 74.0 in | Wt 237.6 lb

## 2023-12-16 DIAGNOSIS — E1159 Type 2 diabetes mellitus with other circulatory complications: Secondary | ICD-10-CM | POA: Diagnosis not present

## 2023-12-16 DIAGNOSIS — E1165 Type 2 diabetes mellitus with hyperglycemia: Secondary | ICD-10-CM | POA: Diagnosis not present

## 2023-12-16 DIAGNOSIS — Z6830 Body mass index (BMI) 30.0-30.9, adult: Secondary | ICD-10-CM

## 2023-12-16 DIAGNOSIS — E871 Hypo-osmolality and hyponatremia: Secondary | ICD-10-CM | POA: Diagnosis not present

## 2023-12-16 DIAGNOSIS — E1169 Type 2 diabetes mellitus with other specified complication: Secondary | ICD-10-CM | POA: Insufficient documentation

## 2023-12-16 DIAGNOSIS — E66811 Obesity, class 1: Secondary | ICD-10-CM

## 2023-12-16 DIAGNOSIS — E782 Mixed hyperlipidemia: Secondary | ICD-10-CM

## 2023-12-16 DIAGNOSIS — E6609 Other obesity due to excess calories: Secondary | ICD-10-CM | POA: Insufficient documentation

## 2023-12-16 DIAGNOSIS — E781 Pure hyperglyceridemia: Secondary | ICD-10-CM

## 2023-12-16 DIAGNOSIS — I152 Hypertension secondary to endocrine disorders: Secondary | ICD-10-CM

## 2023-12-16 DIAGNOSIS — E119 Type 2 diabetes mellitus without complications: Secondary | ICD-10-CM | POA: Insufficient documentation

## 2023-12-16 DIAGNOSIS — Z794 Long term (current) use of insulin: Secondary | ICD-10-CM

## 2023-12-16 LAB — POCT CBG (FASTING - GLUCOSE)-MANUAL ENTRY: Glucose Fasting, POC: 294 mg/dL — AB (ref 70–99)

## 2023-12-16 MED ORDER — SEMAGLUTIDE (1 MG/DOSE) 4 MG/3ML ~~LOC~~ SOPN: 1.0000 mg | PEN_INJECTOR | SUBCUTANEOUS | 1 refills | Status: DC

## 2023-12-16 MED ORDER — NOVOLIN 70/30 FLEXPEN (70-30) 100 UNIT/ML ~~LOC~~ SUPN: 25.0000 [IU] | PEN_INJECTOR | Freq: Two times a day (BID) | SUBCUTANEOUS | 11 refills | Status: DC

## 2023-12-16 MED ORDER — LISINOPRIL-HYDROCHLOROTHIAZIDE 10-12.5 MG PO TABS: 1.0000 | ORAL_TABLET | Freq: Every day | ORAL | 11 refills | Status: DC

## 2023-12-16 NOTE — Progress Notes (Signed)
 Established Patient Office Visit  Subjective:  Patient ID: Gregory Bowman, male    DOB: [birth year]  Age: 53 y.o. MRN: [id number]  Chief Complaint  Patient presents with   Follow-up    Patient is here today for follow up. He reports doing well. His blood pressure is elevated today. He reports headaches, nasal congestion. Denies fever and chills.  Will add on hydrochlorothiazide to Zestril for BP control and continue amlodipine 10 mg daily.  Will collect UAC today for kidney health evaluation. He has started back his insulin and glipizide. He reports toujeo 30 units in morning and 25 units at bedtime. Fasting blood glucose levels are running >200 for the last week. He reports eating a keto diet and does not eat many carbohydrates. Will d/c toujeo and switch to 70/30 insulin 25 units twice a day. Keep blood sugars twice a day. He has gained 14 lbs in the last month. Will go up on Ozempic to 1 mg weekly injection to help with weight loss and enhance blood sugar control. He endorses diabetic neuropathy that is worse in the mornings in his hands and feet. He is not on gabapentin at this time.  His triglycerides were elevated 11/2023 and was started on Tricor in addition to his Lipitor. He has hx of pancreatitis and has had his gallbladder removed. Will recheck labs in 3 months and make medication adjustments as needed based off lab results. Reinforced healthy diet and exercise as tolerated to improve cholesterol, triglycerides and blood sugar control.  Reports father has colon cancer, hx stroke; MGF had NAFLD; Mother was diabetic and had lymphoma; Brother passed away at very young age from leukemia.    No other concerns at this time.   Past Medical History:  Diagnosis Date   Allergic rhinitis    Diabetes type 2, controlled (HCC)    Family history of colon cancer    Hepatic steatosis    Hypercalcemia    Hypertension    Hypertriglyceridemia    Recurrent pancreatitis    Vitamin D  deficiency     Past Surgical History:  Procedure Laterality Date   COLONOSCOPY N/A 10/10/2021   Procedure: COLONOSCOPY;  Surgeon: Jinny Carmine, MD;  Location: [facility];  Service: Endoscopy;  Laterality: N/A;   COLONOSCOPY WITH PROPOFOL  N/A 06/18/2020   Procedure: COLONOSCOPY WITH PROPOFOL ;  Surgeon: Janalyn Keene NOVAK, MD;  Location: [facility];  Service: Endoscopy;  Laterality: N/A;   LAPAROSCOPIC CHOLECYSTECTOMY  05/09/14   Dr. Wonda GLENWOOD [facility]   POLYPECTOMY  10/10/2021   Procedure: POLYPECTOMY;  Surgeon: Jinny Carmine, MD;  Location: [facility];  Service: Endoscopy;;    Social History   Socioeconomic History   Marital status: Single    Spouse name: Not on file   Number of children: Not on file   Years of education: Not on file   Highest education level: Not on file  Occupational History   Not on file  Tobacco Use   Smoking status: Never   Smokeless tobacco: Never  Vaping Use   Vaping status: Never Used  Substance and Sexual Activity   Alcohol use: Yes    Alcohol/week: 0.0 standard drinks of alcohol    Comment: occassionally    Drug use: No   Sexual activity: Not on file  Other Topics Concern   Not on file  Social History Narrative   Not on file   Social Drivers of Health   Financial Resource Strain: Not on file  Food  Insecurity: Not on file  Transportation Needs: Not on file  Physical Activity: Not on file  Stress: Not on file  Social Connections: Not on file  Intimate Partner Violence: Not on file    Family History  Problem Relation Age of Onset   Diabetes Mother    Lymphoma Mother    COPD Mother    Stroke Father    COPD Father    Colon cancer Father     No Known Allergies  Outpatient Medications Prior to Visit  Medication Sig   amLODipine (NORVASC) 10 MG tablet Take 1 tablet (10 mg total) by mouth daily.   atorvastatin (LIPITOR) 10 MG tablet Take 1 tablet (10 mg total) by mouth daily.   Cinnamon 500 MG TABS Take by mouth.    fenofibrate (TRICOR) 145 MG tablet Take 1 tablet (145 mg total) by mouth daily.   glipiZIDE (GLUCOTROL XL) 5 MG 24 hr tablet 2 tablets in the morning and 1 in the evening   metFORMIN (GLUCOPHAGE) 1000 MG tablet Take 1 tablet (1,000 mg total) by mouth 2 (two) times daily with a meal.   metoprolol tartrate (LOPRESSOR) 25 MG tablet Take 1 tablet (25 mg total) by mouth 2 (two) times daily.   Omega-3 Fatty Acids (FISH OIL TRIPLE STRENGTH) 1400 MG CAPS Take by mouth.   [DISCONTINUED] insulin glargine, 2 Unit Dial, (TOUJEO MAX SOLOSTAR) 300 UNIT/ML Solostar Pen Inject 30 units into the skin daily in the morning and 25 units in the evening   [DISCONTINUED] lisinopril (ZESTRIL) 5 MG tablet TAKE 2 TABLETS BY MOUTH ONCE DAILY   [DISCONTINUED] Semaglutide,0.25 or 0.5MG /DOS, (OZEMPIC, 0.25 OR 0.5 MG/DOSE,) 2 MG/1.5ML SOPN Inject 0.5 mg into the skin once a week.   No facility-administered medications prior to visit.    Review of Systems  Constitutional: Negative.  Negative for chills, fever and malaise/fatigue.  HENT: Negative.  Negative for congestion and sore throat.   Eyes: Negative.  Negative for blurred vision and pain.  Respiratory: Negative.  Negative for cough and shortness of breath.   Cardiovascular: Negative.  Negative for chest pain, palpitations and leg swelling.  Gastrointestinal: Negative.  Negative for abdominal pain, blood in stool, constipation, diarrhea, heartburn, melena, nausea and vomiting.  Genitourinary: Negative.  Negative for dysuria, flank pain, frequency and urgency.  Musculoskeletal: Negative.  Negative for joint pain and myalgias.  Skin: Negative.   Neurological:  Positive for tingling and sensory change. Negative for dizziness, weakness and headaches.  Endo/Heme/Allergies: Negative.   Psychiatric/Behavioral: Negative.  Negative for depression and suicidal ideas. The patient is not nervous/anxious.        Objective:   BP (!) 174/96   Pulse 66   Ht 6' 2 (1.88 m)    Wt 237 lb 9.6 oz (107.8 kg)   SpO2 98%   BMI 30.51 kg/m   Vitals:   12/16/23 1519  BP: (!) 174/96  Pulse: 66  Height: 6' 2 (1.88 m)  Weight: 237 lb 9.6 oz (107.8 kg)  SpO2: 98%  BMI (Calculated): 30.49    Physical Exam Vitals and nursing note reviewed.  Constitutional:      General: He is not in acute distress.    Appearance: Normal appearance. He is not ill-appearing.  HENT:     Head: Normocephalic and atraumatic.     Nose: Nose normal.     Mouth/Throat:     Mouth: Mucous membranes are moist.     Pharynx: Oropharynx is clear.  Eyes:     Conjunctiva/sclera:  Conjunctivae normal.     Pupils: Pupils are equal, round, and reactive to light.  Cardiovascular:     Rate and Rhythm: Normal rate and regular rhythm.     Pulses: Normal pulses.     Heart sounds: Normal heart sounds.  Pulmonary:     Effort: Pulmonary effort is normal.     Breath sounds: Normal breath sounds. No wheezing or rhonchi.  Abdominal:     General: Bowel sounds are normal. There is no distension.     Palpations: Abdomen is soft.     Tenderness: There is no abdominal tenderness.  Musculoskeletal:        General: Normal range of motion.     Cervical back: Normal range of motion and neck supple.     Right lower leg: No edema.     Left lower leg: No edema.  Skin:    General: Skin is warm and dry.     Capillary Refill: Capillary refill takes less than 2 seconds.  Neurological:     General: No focal deficit present.     Mental Status: He is alert and oriented to person, place, and time.     Sensory: No sensory deficit.     Motor: No weakness.  Psychiatric:        Mood and Affect: Mood normal.        Behavior: Behavior normal.        Judgment: Judgment normal.      Results for orders placed or performed in visit on 12/16/23  POCT CBG (Fasting - Glucose)  Result Value Ref Range   Glucose Fasting, POC 294 (A) 70 - 99 mg/dL    Recent Results (from the past 2160 hours)  CMP14+EGFR     Status:  Abnormal   Collection Time: 11/09/23 10:42 AM  Result Value Ref Range   Glucose 268 (H) 70 - 99 mg/dL   BUN 22 6 - 24 mg/dL   Creatinine, Ser 8.95 0.76 - 1.27 mg/dL   eGFR 85 >40 fO/fpw/8.26   BUN/Creatinine Ratio 21 (H) 9 - 20   Sodium 133 (L) 134 - 144 mmol/L   Potassium 4.5 3.5 - 5.2 mmol/L   Chloride 95 (L) 96 - 106 mmol/L   CO2 22 20 - 29 mmol/L   Calcium 10.2 8.7 - 10.2 mg/dL   Total Protein 7.6 6.0 - 8.5 g/dL   Albumin 4.8 3.8 - 4.9 g/dL   Globulin, Total 2.8 1.5 - 4.5 g/dL   Bilirubin Total 0.5 0.0 - 1.2 mg/dL   Alkaline Phosphatase 93 47 - 123 IU/L   AST 27 0 - 40 IU/L   ALT 45 (H) 0 - 44 IU/L  Lipid Profile     Status: Abnormal   Collection Time: 11/09/23 10:42 AM  Result Value Ref Range   Cholesterol, Total 216 (H) 100 - 199 mg/dL   Triglycerides 357 (HH) 0 - 149 mg/dL   HDL 28 (L) >60 mg/dL   VLDL Cholesterol Cal 104 (H) 5 - 40 mg/dL   LDL Chol Calc (NIH) 84 0 - 99 mg/dL   Chol/HDL Ratio 7.7 (H) 0.0 - 5.0 ratio    Comment:                                   T. Chol/HDL Ratio  Men  Women                               1/2 Avg.Risk  3.4    3.3                                   Avg.Risk  5.0    4.4                                2X Avg.Risk  9.6    7.1                                3X Avg.Risk 23.4   11.0   Hemoglobin A1c     Status: Abnormal   Collection Time: 11/09/23 10:42 AM  Result Value Ref Range   Hgb A1c MFr Bld 9.3 (H) 4.8 - 5.6 %    Comment:          Prediabetes: 5.7 - 6.4          Diabetes: >6.4          Glycemic control for adults with diabetes: <7.0    Est. average glucose Bld gHb Est-mCnc 220 mg/dL  TSH     Status: None   Collection Time: 11/09/23 10:42 AM  Result Value Ref Range   TSH 0.891 0.450 - 4.500 uIU/mL  PSA     Status: None   Collection Time: 11/09/23 10:42 AM  Result Value Ref Range   Prostate Specific Ag, Serum 0.8 0.0 - 4.0 ng/mL    Comment: Roche ECLIA methodology. According to the  American Urological Association, Serum PSA should decrease and remain at undetectable levels after radical prostatectomy. The AUA defines biochemical recurrence as an initial PSA value 0.2 ng/mL or greater followed by a subsequent confirmatory PSA value 0.2 ng/mL or greater. Values obtained with different assay methods or kits cannot be used interchangeably. Results cannot be interpreted as absolute evidence of the presence or absence of malignant disease.   POCT CBG (Fasting - Glucose)     Status: Abnormal   Collection Time: 12/16/23  3:23 PM  Result Value Ref Range   Glucose Fasting, POC 294 (A) 70 - 99 mg/dL      Assessment & Plan:  Start lisinopril-hydrochlorothiazide 10-12.5 mg daily. Start Ozempic 1 mg weekly injection. Start 70/30 insulin 25 units twice a day. Stop Toujeo insulin. Continue other medications as prescribed. Check BMP today. Reinforced healthy diet and exercise as tolerated. Check UAC today. Problem List Items Addressed This Visit     Hypertriglyceridemia   Relevant Medications   lisinopril-hydrochlorothiazide (ZESTORETIC) 10-12.5 MG tablet   BP (high blood pressure) - Primary   Relevant Medications   lisinopril-hydrochlorothiazide (ZESTORETIC) 10-12.5 MG tablet   Type 2 diabetes mellitus without complication, with long-term current use of insulin (HCC)   Relevant Medications   Semaglutide, 1 MG/DOSE, 4 MG/3ML SOPN   lisinopril-hydrochlorothiazide (ZESTORETIC) 10-12.5 MG tablet   insulin isophane & regular human KwikPen (NOVOLIN 70/30 KWIKPEN) (70-30) 100 UNIT/ML KwikPen   Other Relevant Orders   POCT CBG (Fasting - Glucose) (Completed)   POC CREATINE & ALBUMIN,URINE   Hyponatremia   Relevant Orders   Basic metabolic panel with GFR   Combined hyperlipidemia associated with type  2 diabetes mellitus (HCC)   Relevant Medications   Semaglutide, 1 MG/DOSE, 4 MG/3ML SOPN   lisinopril-hydrochlorothiazide (ZESTORETIC) 10-12.5 MG tablet   insulin isophane &  regular human KwikPen (NOVOLIN 70/30 KWIKPEN) (70-30) 100 UNIT/ML KwikPen   Class 1 obesity due to excess calories with serious comorbidity and body mass index (BMI) of 30.0 to 30.9 in adult   Relevant Medications   Semaglutide, 1 MG/DOSE, 4 MG/3ML SOPN   insulin isophane & regular human KwikPen (NOVOLIN 70/30 KWIKPEN) (70-30) 100 UNIT/ML KwikPen    Return in about 10 days (around 12/26/2023).   Total time spent: 25 minutes. This time includes review of previous notes and results and patient face to face interaction during today's visit.    FERNAND FREDY RAMAN, MD  12/16/2023   This document may have been prepared by [facility] Voice Recognition software and as such may include unintentional dictation errors.

## 2023-12-16 NOTE — Patient Instructions (Addendum)
 Continue amlodipine. Start new BP medication Zestril- hydrochlorothiazide 10-12.5 mg. Stop zestril 5 mg. Increase Ozempic 1 mg weekly Stop toujeo insulin. Start 70/30 25 units in the morning and at bedtime.

## 2023-12-17 LAB — BASIC METABOLIC PANEL WITH GFR
BUN/Creatinine Ratio: 22 — ABNORMAL HIGH (ref 9–20)
BUN: 26 mg/dL — ABNORMAL HIGH (ref 6–24)
CO2: 22 mmol/L (ref 20–29)
Calcium: 10.3 mg/dL — ABNORMAL HIGH (ref 8.7–10.2)
Chloride: 96 mmol/L (ref 96–106)
Creatinine, Ser: 1.16 mg/dL (ref 0.76–1.27)
Glucose: 302 mg/dL — ABNORMAL HIGH (ref 70–99)
Potassium: 4.2 mmol/L (ref 3.5–5.2)
Sodium: 133 mmol/L — ABNORMAL LOW (ref 134–144)
eGFR: 74 mL/min/1.73 (ref >59)

## 2023-12-20 ENCOUNTER — Other Ambulatory Visit: Payer: Self-pay | Admitting: Internal Medicine

## 2023-12-20 ENCOUNTER — Telehealth: Payer: Self-pay | Admitting: Internal Medicine

## 2023-12-20 DIAGNOSIS — E1165 Type 2 diabetes mellitus with hyperglycemia: Secondary | ICD-10-CM

## 2023-12-20 MED ORDER — INSULIN LISPRO PROT & LISPRO (75-25 MIX) 100 UNIT/ML KWIKPEN: 25.0000 [IU] | PEN_INJECTOR | Freq: Two times a day (BID) | SUBCUTANEOUS | 11 refills | Status: DC

## 2023-12-20 NOTE — Telephone Encounter (Signed)
 Patient left VM stating his insurance will not cover the insulin that was called in for him. Maybe Humalog 70/30 will be covered.

## 2023-12-27 ENCOUNTER — Ambulatory Visit: Admitting: Internal Medicine

## 2023-12-27 ENCOUNTER — Ambulatory Visit: Payer: Self-pay | Admitting: Internal Medicine

## 2023-12-27 ENCOUNTER — Encounter: Payer: Self-pay | Admitting: Internal Medicine

## 2023-12-27 VITALS — BP 140/80 | HR 68 | Ht 74.0 in | Wt 232.0 lb

## 2023-12-27 DIAGNOSIS — E1165 Type 2 diabetes mellitus with hyperglycemia: Secondary | ICD-10-CM

## 2023-12-27 DIAGNOSIS — E663 Overweight: Secondary | ICD-10-CM | POA: Insufficient documentation

## 2023-12-27 DIAGNOSIS — E1159 Type 2 diabetes mellitus with other circulatory complications: Secondary | ICD-10-CM | POA: Diagnosis not present

## 2023-12-27 DIAGNOSIS — I152 Hypertension secondary to endocrine disorders: Secondary | ICD-10-CM

## 2023-12-27 DIAGNOSIS — E871 Hypo-osmolality and hyponatremia: Secondary | ICD-10-CM

## 2023-12-27 DIAGNOSIS — E782 Mixed hyperlipidemia: Secondary | ICD-10-CM | POA: Diagnosis not present

## 2023-12-27 DIAGNOSIS — Z794 Long term (current) use of insulin: Secondary | ICD-10-CM | POA: Diagnosis not present

## 2023-12-27 DIAGNOSIS — J301 Allergic rhinitis due to pollen: Secondary | ICD-10-CM

## 2023-12-27 DIAGNOSIS — Z6829 Body mass index (BMI) 29.0-29.9, adult: Secondary | ICD-10-CM

## 2023-12-27 DIAGNOSIS — E1169 Type 2 diabetes mellitus with other specified complication: Secondary | ICD-10-CM

## 2023-12-27 LAB — POC CREATINE & ALBUMIN,URINE
Albumin/Creatinine Ratio, Urine, POC: >300
Creatinine, POC: 100 mg/dL
Microalbumin Ur, POC: 150 mg/L

## 2023-12-27 LAB — POCT CBG (FASTING - GLUCOSE)-MANUAL ENTRY: Glucose Fasting, POC: 177 mg/dL — AB (ref 70–99)

## 2023-12-27 MED ORDER — AZELASTINE HCL 0.1 % NA SOLN
2.0000 | Freq: Two times a day (BID) | NASAL | 12 refills | Status: AC
Start: 2023-12-27 — End: ?

## 2023-12-27 NOTE — Progress Notes (Signed)
 Established Patient Office Visit  Subjective:  Patient ID: Gregory Bowman, male    DOB: Jan 20, 1971  Age: 53 y.o. MRN: [id number]  Chief Complaint  Patient presents with   Follow-up    10 day follow up    Patient is here today for follow up. He reports doing well but states his blood sugars are 180-500 since switching to 75/25 insulin . He reports giving himself 25 units twice a day. Will increase to 30 units twice a day. If after 1 week he can increase to 35 units twice day if blood sugars remain above 150. Reinforced healthy diet and exercise as tolerated.  Patient reports having Ozempic  0.5 mg weekly injections still at home and reports having 2 months supply. He states his insurance will not send him the Ozempic  1 mg weekly injection until he uses his current supply.   Patients BP is elevated today but improved since his last visit. Will check BMP today and adjust BP medication accordingly.    No other concerns at this time.   Past Medical History:  Diagnosis Date   Allergic rhinitis    Diabetes type 2, controlled (HCC)    Family history of colon cancer    Hepatic steatosis    Hypercalcemia    Hypertension    Hypertriglyceridemia    Recurrent pancreatitis    Vitamin D deficiency     Past Surgical History:  Procedure Laterality Date   COLONOSCOPY N/A 10/10/2021   Procedure: COLONOSCOPY;  Surgeon: Jinny Carmine, MD;  Location: [facility];  Service: Endoscopy;  Laterality: N/A;   COLONOSCOPY WITH PROPOFOL  N/A 06/18/2020   Procedure: COLONOSCOPY WITH PROPOFOL ;  Surgeon: Janalyn Keene NOVAK, MD;  Location: [facility];  Service: Endoscopy;  Laterality: N/A;   LAPAROSCOPIC CHOLECYSTECTOMY  05/09/14   Dr. Wonda GLENWOOD [facility]   POLYPECTOMY  10/10/2021   Procedure: POLYPECTOMY;  Surgeon: Jinny Carmine, MD;  Location: [facility];  Service: Endoscopy;;    Social History   Socioeconomic History   Marital status: Single    Spouse name: Not on file   Number of  children: Not on file   Years of education: Not on file   Highest education level: Not on file  Occupational History   Not on file  Tobacco Use   Smoking status: Never   Smokeless tobacco: Never  Vaping Use   Vaping status: Never Used  Substance and Sexual Activity   Alcohol use: Yes    Alcohol/week: 0.0 standard drinks of alcohol    Comment: occassionally    Drug use: No   Sexual activity: Not on file  Other Topics Concern   Not on file  Social History Narrative   Not on file   Social Drivers of Health   Financial Resource Strain: Not on file  Food Insecurity: Not on file  Transportation Needs: Not on file  Physical Activity: Not on file  Stress: Not on file  Social Connections: Not on file  Intimate Partner Violence: Not on file    Family History  Problem Relation Age of Onset   Diabetes Mother    Lymphoma Mother    COPD Mother    Stroke Father    COPD Father    Colon cancer Father     No Known Allergies  Outpatient Medications Prior to Visit  Medication Sig   amLODipine  (NORVASC ) 10 MG tablet Take 1 tablet (10 mg total) by mouth daily.   atorvastatin  (LIPITOR) 10 MG tablet Take 1 tablet (  10 mg total) by mouth daily.   Cinnamon 500 MG TABS Take by mouth.   fenofibrate  (TRICOR ) 145 MG tablet Take 1 tablet (145 mg total) by mouth daily.   glipiZIDE  (GLUCOTROL  XL) 5 MG 24 hr tablet 2 tablets in the morning and 1 in the evening   Insulin  Lispro Prot & Lispro (HUMALOG  75/25 MIX) (75-25) 100 UNIT/ML Kwikpen Inject 25 Units into the skin in the morning and at bedtime.   lisinopril -hydrochlorothiazide  (ZESTORETIC ) 10-12.5 MG tablet Take 1 tablet by mouth daily.   metFORMIN  (GLUCOPHAGE ) 1000 MG tablet Take 1 tablet (1,000 mg total) by mouth 2 (two) times daily with a meal.   metoprolol  tartrate (LOPRESSOR ) 25 MG tablet Take 1 tablet (25 mg total) by mouth 2 (two) times daily.   Omega-3 Fatty Acids (FISH OIL TRIPLE STRENGTH) 1400 MG CAPS Take by mouth.   Semaglutide , 1  MG/DOSE, 4 MG/3ML SOPN Inject 1 mg into the skin once a week. (Patient taking differently: Inject 0.5 mg into the skin once a week.)   No facility-administered medications prior to visit.    Review of Systems  Constitutional: Negative.  Negative for chills, fever and malaise/fatigue.  HENT: Negative.  Negative for congestion and sore throat.   Eyes: Negative.  Negative for blurred vision and pain.  Respiratory: Negative.  Negative for cough and shortness of breath.   Cardiovascular: Negative.  Negative for chest pain, palpitations and leg swelling.  Gastrointestinal: Negative.  Negative for abdominal pain, blood in stool, constipation, diarrhea, heartburn, melena, nausea and vomiting.  Genitourinary: Negative.  Negative for dysuria, flank pain, frequency and urgency.  Musculoskeletal: Negative.  Negative for joint pain and myalgias.  Skin: Negative.   Neurological: Negative.  Negative for dizziness, tingling, sensory change, weakness and headaches.  Endo/Heme/Allergies: Negative.   Psychiatric/Behavioral: Negative.  Negative for depression and suicidal ideas. The patient is not nervous/anxious.        Objective:   BP (!) 140/80   Pulse 68   Ht 6' 2 (1.88 m)   Wt 232 lb (105.2 kg)   SpO2 97%   BMI 29.79 kg/m   Vitals:   12/27/23 1500  BP: (!) 140/80  Pulse: 68  Height: 6' 2 (1.88 m)  Weight: 232 lb (105.2 kg)  SpO2: 97%  BMI (Calculated): 29.77    Physical Exam Vitals and nursing note reviewed.  Constitutional:      General: He is not in acute distress.    Appearance: Normal appearance. He is not ill-appearing.  HENT:     Head: Normocephalic and atraumatic.     Nose: Nose normal.     Mouth/Throat:     Mouth: Mucous membranes are moist.     Pharynx: Oropharynx is clear.  Eyes:     Conjunctiva/sclera: Conjunctivae normal.     Pupils: Pupils are equal, round, and reactive to light.  Cardiovascular:     Rate and Rhythm: Normal rate and regular rhythm.      Pulses: Normal pulses.     Heart sounds: Normal heart sounds.  Pulmonary:     Effort: Pulmonary effort is normal.     Breath sounds: Normal breath sounds. No wheezing or rhonchi.  Abdominal:     General: Bowel sounds are normal. There is no distension.     Palpations: Abdomen is soft.     Tenderness: There is no abdominal tenderness.  Musculoskeletal:        General: Normal range of motion.     Cervical back: Normal range  of motion and neck supple.     Right lower leg: No edema.     Left lower leg: No edema.  Skin:    General: Skin is warm and dry.     Capillary Refill: Capillary refill takes less than 2 seconds.  Neurological:     General: No focal deficit present.     Mental Status: He is alert and oriented to person, place, and time.     Sensory: No sensory deficit.     Motor: No weakness.  Psychiatric:        Mood and Affect: Mood normal.        Behavior: Behavior normal.        Judgment: Judgment normal.      Results for orders placed or performed in visit on 12/27/23  POCT CBG (Fasting - Glucose)  Result Value Ref Range   Glucose Fasting, POC 177 (A) 70 - 99 mg/dL  POC CREATINE & ALBUMIN,URINE  Result Value Ref Range   Microalbumin Ur, POC 150 mg/L   Creatinine, POC 100 mg/dL   Albumin/Creatinine Ratio, Urine, POC >300     Recent Results (from the past 2160 hours)  CMP14+EGFR     Status: Abnormal   Collection Time: 11/09/23 10:42 AM  Result Value Ref Range   Glucose 268 (H) 70 - 99 mg/dL   BUN 22 6 - 24 mg/dL   Creatinine, Ser 8.95 0.76 - 1.27 mg/dL   eGFR 85 >40 fO/fpw/8.26   BUN/Creatinine Ratio 21 (H) 9 - 20   Sodium 133 (L) 134 - 144 mmol/L   Potassium 4.5 3.5 - 5.2 mmol/L   Chloride 95 (L) 96 - 106 mmol/L   CO2 22 20 - 29 mmol/L   Calcium  10.2 8.7 - 10.2 mg/dL   Total Protein 7.6 6.0 - 8.5 g/dL   Albumin 4.8 3.8 - 4.9 g/dL   Globulin, Total 2.8 1.5 - 4.5 g/dL   Bilirubin Total 0.5 0.0 - 1.2 mg/dL   Alkaline Phosphatase 93 47 - 123 IU/L   AST 27 0  - 40 IU/L   ALT 45 (H) 0 - 44 IU/L  Lipid Profile     Status: Abnormal   Collection Time: 11/09/23 10:42 AM  Result Value Ref Range   Cholesterol, Total 216 (H) 100 - 199 mg/dL   Triglycerides 357 (HH) 0 - 149 mg/dL   HDL 28 (L) >60 mg/dL   VLDL Cholesterol Cal 104 (H) 5 - 40 mg/dL   LDL Chol Calc (NIH) 84 0 - 99 mg/dL   Chol/HDL Ratio 7.7 (H) 0.0 - 5.0 ratio    Comment:                                   T. Chol/HDL Ratio                                             Men  Women                               1/2 Avg.Risk  3.4    3.3  Avg.Risk  5.0    4.4                                2X Avg.Risk  9.6    7.1                                3X Avg.Risk 23.4   11.0   Hemoglobin A1c     Status: Abnormal   Collection Time: 11/09/23 10:42 AM  Result Value Ref Range   Hgb A1c MFr Bld 9.3 (H) 4.8 - 5.6 %    Comment:          Prediabetes: 5.7 - 6.4          Diabetes: >6.4          Glycemic control for adults with diabetes: <7.0    Est. average glucose Bld gHb Est-mCnc 220 mg/dL  TSH     Status: None   Collection Time: 11/09/23 10:42 AM  Result Value Ref Range   TSH 0.891 0.450 - 4.500 uIU/mL  PSA     Status: None   Collection Time: 11/09/23 10:42 AM  Result Value Ref Range   Prostate Specific Ag, Serum 0.8 0.0 - 4.0 ng/mL    Comment: Roche ECLIA methodology. According to the American Urological Association, Serum PSA should decrease and remain at undetectable levels after radical prostatectomy. The AUA defines biochemical recurrence as an initial PSA value 0.2 ng/mL or greater followed by a subsequent confirmatory PSA value 0.2 ng/mL or greater. Values obtained with different assay methods or kits cannot be used interchangeably. Results cannot be interpreted as absolute evidence of the presence or absence of malignant disease.   POCT CBG (Fasting - Glucose)     Status: Abnormal   Collection Time: 12/16/23  3:23 PM  Result Value Ref Range   Glucose  Fasting, POC 294 (A) 70 - 99 mg/dL  Basic metabolic panel with GFR     Status: Abnormal   Collection Time: 12/16/23  4:28 PM  Result Value Ref Range   Glucose 302 (H) 70 - 99 mg/dL   BUN 26 (H) 6 - 24 mg/dL   Creatinine, Ser 8.83 0.76 - 1.27 mg/dL   eGFR 74 >40 fO/fpw/8.26   BUN/Creatinine Ratio 22 (H) 9 - 20   Sodium 133 (L) 134 - 144 mmol/L   Potassium 4.2 3.5 - 5.2 mmol/L   Chloride 96 96 - 106 mmol/L   CO2 22 20 - 29 mmol/L   Calcium  10.3 (H) 8.7 - 10.2 mg/dL  POCT CBG (Fasting - Glucose)     Status: Abnormal   Collection Time: 12/27/23  3:04 PM  Result Value Ref Range   Glucose Fasting, POC 177 (A) 70 - 99 mg/dL  POC CREATINE & ALBUMIN,URINE     Status: Abnormal   Collection Time: 12/27/23  3:46 PM  Result Value Ref Range   Microalbumin Ur, POC 150 mg/L   Creatinine, POC 100 mg/dL   Albumin/Creatinine Ratio, Urine, POC >300       Assessment & Plan:  Increase 75/25 insulin  to 30 units twice daily. Can increase to 35 units after 1 week for blood sugars above 150. Continue other medications as prescribed. Check BMP today. Reinforced healthy diet and exercise as tolerated. Check UAC today. Problem List Items Addressed This Visit     Allergic rhinitis   Relevant Medications  azelastine  (ASTELIN ) 0.1 % nasal spray   Hyponatremia   Relevant Orders   Basic metabolic panel with GFR   Combined hyperlipidemia associated with type 2 diabetes mellitus (HCC)   Overweight with body mass index (BMI) of 29 to 29.9 in adult   Hypertension associated with diabetes (HCC) - Primary   Relevant Orders   Basic metabolic panel with GFR   Type 2 diabetes mellitus with hyperglycemia, with long-term current use of insulin  (HCC)   Relevant Orders   POCT CBG (Fasting - Glucose) (Completed)   POC CREATINE & ALBUMIN,URINE (Completed)    Return in about 2 weeks (around 01/10/2024).   Total time spent: 25 minutes. This time includes review of previous notes and results and patient face to face  interaction during today's visit.    FERNAND FREDY RAMAN, MD  12/27/2023   This document may have been prepared by [facility] Voice Recognition software and as such may include unintentional dictation errors.

## 2023-12-28 LAB — BASIC METABOLIC PANEL WITH GFR
BUN/Creatinine Ratio: 23 — ABNORMAL HIGH (ref 9–20)
BUN: 29 mg/dL — ABNORMAL HIGH (ref 6–24)
CO2: 21 mmol/L (ref 20–29)
Calcium: 10.4 mg/dL — ABNORMAL HIGH (ref 8.7–10.2)
Chloride: 94 mmol/L — ABNORMAL LOW (ref 96–106)
Creatinine, Ser: 1.26 mg/dL (ref 0.76–1.27)
Glucose: 199 mg/dL — ABNORMAL HIGH (ref 70–99)
Potassium: 3.8 mmol/L (ref 3.5–5.2)
Sodium: 132 mmol/L — ABNORMAL LOW (ref 134–144)
eGFR: 67 mL/min/1.73 (ref >59)

## 2023-12-28 MED ORDER — LISINOPRIL-HYDROCHLOROTHIAZIDE 20-12.5 MG PO TABS: 1.0000 | ORAL_TABLET | Freq: Every day | ORAL | 11 refills | Status: DC

## 2023-12-29 NOTE — Progress Notes (Signed)
Pt informed

## 2024-01-10 ENCOUNTER — Encounter: Payer: Self-pay | Admitting: Internal Medicine

## 2024-01-10 ENCOUNTER — Ambulatory Visit: Admitting: Internal Medicine

## 2024-01-10 VITALS — BP 138/70 | HR 86 | Ht 74.0 in | Wt 231.6 lb

## 2024-01-10 DIAGNOSIS — Z6829 Body mass index (BMI) 29.0-29.9, adult: Secondary | ICD-10-CM

## 2024-01-10 DIAGNOSIS — I152 Hypertension secondary to endocrine disorders: Secondary | ICD-10-CM

## 2024-01-10 DIAGNOSIS — E1169 Type 2 diabetes mellitus with other specified complication: Secondary | ICD-10-CM

## 2024-01-10 DIAGNOSIS — E781 Pure hyperglyceridemia: Secondary | ICD-10-CM

## 2024-01-10 DIAGNOSIS — E1165 Type 2 diabetes mellitus with hyperglycemia: Secondary | ICD-10-CM

## 2024-01-10 LAB — POCT URINALYSIS DIPSTICK
Bilirubin, UA: NEGATIVE
Blood, UA: NEGATIVE
Glucose, UA: POSITIVE — AB
Ketones, UA: NEGATIVE
Leukocytes, UA: NEGATIVE
Nitrite, UA: NEGATIVE
Protein, UA: POSITIVE — AB
Spec Grav, UA: 1.02 (ref 1.010–1.025)
Urobilinogen, UA: 0.2 U/dL
pH, UA: 5.5 (ref 5.0–8.0)

## 2024-01-10 LAB — POCT CBG (FASTING - GLUCOSE)-MANUAL ENTRY: Glucose Fasting, POC: 280 mg/dL — AB (ref 70–99)

## 2024-01-10 MED ORDER — EMPAGLIFLOZIN 25 MG PO TABS: 25.0000 mg | ORAL_TABLET | Freq: Every day | ORAL | 6 refills | Status: AC

## 2024-01-10 MED ORDER — ROSUVASTATIN CALCIUM 20 MG PO TABS: 20.0000 mg | ORAL_TABLET | Freq: Every day | ORAL | 3 refills | Status: DC

## 2024-01-10 NOTE — Progress Notes (Signed)
 Established Patient Office Visit  Subjective:  Patient ID: Gregory Bowman, male    DOB: [birth year]  Age: 53 y.o. MRN: [id number]  Chief Complaint  Patient presents with   Follow-up    2 week follow up    Patient is here today for follow up. He reports blood sugars have not been doing well. He reports fasting blood glucose levels in 300s in the mornings and up to 500 after supper. He reports using 35 units twice a day. He is taking all his other medications as prescribed. Will increase insulin  to 40 units and can increase to 5 units as need for elevated blood sugars 150 stop at 50 units twice daily. Will check UA today to make sure patient isn't experiencing infection. Will add on Jardiance  25 mg daily. Stop glipizide  5 mg daily. He reports having occasion constipation but denies any other abdominal distress. Discussed future endocrinology referrals needed.  He reports eating eggs, sausage and cheese for breakfast. salad each day at lunch with vinegar/oil dressing. Suggested substituting ham pieces for turkey or chicken. Dinner time he eat meat and veggies. He also endorses drinking water  and sugar free drinks on occasion. He states he has been eating ketogenic diet daily. States it used to help control his blood sugars but it is not helping control his blood sugars anymore.  Will also switch to Crestor  20 mg daily for Lipitor 10 mg daily.     No other concerns at this time.   Past Medical History:  Diagnosis Date   Allergic rhinitis    Diabetes type 2, controlled (HCC)    Family history of colon cancer    Hepatic steatosis    Hypercalcemia    Hypertension    Hypertriglyceridemia    Recurrent pancreatitis    Vitamin D deficiency     Past Surgical History:  Procedure Laterality Date   COLONOSCOPY N/A 10/10/2021   Procedure: COLONOSCOPY;  Surgeon: Jinny Carmine, MD;  Location: [facility];  Service: Endoscopy;  Laterality: N/A;   COLONOSCOPY WITH PROPOFOL  N/A 06/18/2020    Procedure: COLONOSCOPY WITH PROPOFOL ;  Surgeon: Janalyn Keene NOVAK, MD;  Location: [facility];  Service: Endoscopy;  Laterality: N/A;   LAPAROSCOPIC CHOLECYSTECTOMY  05/09/14   Dr. Wonda GLENWOOD [facility]   POLYPECTOMY  10/10/2021   Procedure: POLYPECTOMY;  Surgeon: Jinny Carmine, MD;  Location: [facility];  Service: Endoscopy;;    Social History   Socioeconomic History   Marital status: Single    Spouse name: Not on file   Number of children: Not on file   Years of education: Not on file   Highest education level: Not on file  Occupational History   Not on file  Tobacco Use   Smoking status: Never   Smokeless tobacco: Never  Vaping Use   Vaping status: Never Used  Substance and Sexual Activity   Alcohol use: Yes    Alcohol/week: 0.0 standard drinks of alcohol    Comment: occassionally    Drug use: No   Sexual activity: Not on file  Other Topics Concern   Not on file  Social History Narrative   Not on file   Social Drivers of Health   Financial Resource Strain: Not on file  Food Insecurity: Not on file  Transportation Needs: Not on file  Physical Activity: Not on file  Stress: Not on file  Social Connections: Not on file  Intimate Partner Violence: Not on file    Family History  Problem  Relation Age of Onset   Diabetes Mother    Lymphoma Mother    COPD Mother    Stroke Father    COPD Father    Colon cancer Father     No Known Allergies  Outpatient Medications Prior to Visit  Medication Sig   amLODipine  (NORVASC ) 10 MG tablet Take 1 tablet (10 mg total) by mouth daily.   azelastine  (ASTELIN ) 0.1 % nasal spray Place 2 sprays into both nostrils 2 (two) times daily. Use in each nostril as directed   Cinnamon 500 MG TABS Take by mouth.   fenofibrate  (TRICOR ) 145 MG tablet Take 1 tablet (145 mg total) by mouth daily.   glipiZIDE  (GLUCOTROL  XL) 5 MG 24 hr tablet 2 tablets in the morning and 1 in the evening   Insulin  Lispro Prot & Lispro (HUMALOG   75/25 MIX) (75-25) 100 UNIT/ML Kwikpen Inject 25 Units into the skin in the morning and at bedtime.   lisinopril -hydrochlorothiazide  (ZESTORETIC ) 20-12.5 MG tablet Take 1 tablet by mouth daily.   metFORMIN  (GLUCOPHAGE ) 1000 MG tablet Take 1 tablet (1,000 mg total) by mouth 2 (two) times daily with a meal.   metoprolol  tartrate (LOPRESSOR ) 25 MG tablet Take 1 tablet (25 mg total) by mouth 2 (two) times daily.   Omega-3 Fatty Acids (FISH OIL TRIPLE STRENGTH) 1400 MG CAPS Take by mouth.   Semaglutide , 1 MG/DOSE, 4 MG/3ML SOPN Inject 1 mg into the skin once a week. (Patient taking differently: Inject 0.5 mg into the skin once a week.)   [DISCONTINUED] atorvastatin  (LIPITOR) 10 MG tablet Take 1 tablet (10 mg total) by mouth daily.   No facility-administered medications prior to visit.    Review of Systems  Constitutional: Negative.  Negative for chills, fever and malaise/fatigue.  HENT: Negative.  Negative for congestion and sore throat.   Eyes: Negative.  Negative for blurred vision and pain.  Respiratory: Negative.  Negative for cough and shortness of breath.   Cardiovascular: Negative.  Negative for chest pain, palpitations and leg swelling.  Gastrointestinal: Negative.  Negative for abdominal pain, blood in stool, constipation, diarrhea, heartburn, melena, nausea and vomiting.  Genitourinary: Negative.  Negative for dysuria, flank pain, frequency and urgency.  Musculoskeletal: Negative.  Negative for joint pain and myalgias.  Skin: Negative.   Neurological: Negative.  Negative for dizziness, tingling, sensory change, weakness and headaches.  Endo/Heme/Allergies: Negative.   Psychiatric/Behavioral: Negative.  Negative for depression and suicidal ideas. The patient is not nervous/anxious.        Objective:   BP 138/70   Pulse 86   Ht 6' 2 (1.88 m)   Wt 231 lb 9.6 oz (105.1 kg)   SpO2 98%   BMI 29.74 kg/m   Vitals:   01/10/24 1509  BP: 138/70  Pulse: 86  Height: 6' 2 (1.88 m)   Weight: 231 lb 9.6 oz (105.1 kg)  SpO2: 98%  BMI (Calculated): 29.72    Physical Exam Vitals and nursing note reviewed.  Constitutional:      General: He is not in acute distress.    Appearance: Normal appearance. He is not ill-appearing.  HENT:     Head: Normocephalic and atraumatic.     Nose: Nose normal.     Mouth/Throat:     Mouth: Mucous membranes are moist.     Pharynx: Oropharynx is clear.  Eyes:     Conjunctiva/sclera: Conjunctivae normal.     Pupils: Pupils are equal, round, and reactive to light.  Cardiovascular:  Rate and Rhythm: Normal rate and regular rhythm.     Pulses: Normal pulses.     Heart sounds: Normal heart sounds.  Pulmonary:     Effort: Pulmonary effort is normal.     Breath sounds: Normal breath sounds. No wheezing or rhonchi.  Abdominal:     General: Bowel sounds are normal. There is no distension.     Palpations: Abdomen is soft.     Tenderness: There is no abdominal tenderness.  Musculoskeletal:        General: Normal range of motion.     Cervical back: Normal range of motion and neck supple.     Right lower leg: No edema.     Left lower leg: No edema.  Skin:    General: Skin is warm and dry.     Capillary Refill: Capillary refill takes less than 2 seconds.  Neurological:     General: No focal deficit present.     Mental Status: He is alert and oriented to person, place, and time.     Sensory: No sensory deficit.     Motor: No weakness.  Psychiatric:        Mood and Affect: Mood normal.        Behavior: Behavior normal.        Judgment: Judgment normal.      Results for orders placed or performed in visit on 01/10/24  POCT CBG (Fasting - Glucose)  Result Value Ref Range   Glucose Fasting, POC 280 (A) 70 - 99 mg/dL  POCT Urinalysis Dipstick (81002)  Result Value Ref Range   Color, UA Yellow    Clarity, UA Clear    Glucose, UA Positive (A) Negative   Bilirubin, UA Negative    Ketones, UA Negative    Spec Grav, UA 1.020 1.010  - 1.025   Blood, UA Negative    pH, UA 5.5 5.0 - 8.0   Protein, UA Positive (A) Negative   Urobilinogen, UA 0.2 0.2 or 1.0 E.U./dL   Nitrite, UA Negative    Leukocytes, UA Negative Negative   Appearance Clear    Odor Yes     Recent Results (from the past 2160 hours)  CMP14+EGFR     Status: Abnormal   Collection Time: 11/09/23 10:42 AM  Result Value Ref Range   Glucose 268 (H) 70 - 99 mg/dL   BUN 22 6 - 24 mg/dL   Creatinine, Ser 8.95 0.76 - 1.27 mg/dL   eGFR 85 >40 fO/fpw/8.26   BUN/Creatinine Ratio 21 (H) 9 - 20   Sodium 133 (L) 134 - 144 mmol/L   Potassium 4.5 3.5 - 5.2 mmol/L   Chloride 95 (L) 96 - 106 mmol/L   CO2 22 20 - 29 mmol/L   Calcium  10.2 8.7 - 10.2 mg/dL   Total Protein 7.6 6.0 - 8.5 g/dL   Albumin 4.8 3.8 - 4.9 g/dL   Globulin, Total 2.8 1.5 - 4.5 g/dL   Bilirubin Total 0.5 0.0 - 1.2 mg/dL   Alkaline Phosphatase 93 47 - 123 IU/L   AST 27 0 - 40 IU/L   ALT 45 (H) 0 - 44 IU/L  Lipid Profile     Status: Abnormal   Collection Time: 11/09/23 10:42 AM  Result Value Ref Range   Cholesterol, Total 216 (H) 100 - 199 mg/dL   Triglycerides 357 (HH) 0 - 149 mg/dL   HDL 28 (L) >60 mg/dL   VLDL Cholesterol Cal 104 (H) 5 - 40 mg/dL  LDL Chol Calc (NIH) 84 0 - 99 mg/dL   Chol/HDL Ratio 7.7 (H) 0.0 - 5.0 ratio    Comment:                                   T. Chol/HDL Ratio                                             Men  Women                               1/2 Avg.Risk  3.4    3.3                                   Avg.Risk  5.0    4.4                                2X Avg.Risk  9.6    7.1                                3X Avg.Risk 23.4   11.0   Hemoglobin A1c     Status: Abnormal   Collection Time: 11/09/23 10:42 AM  Result Value Ref Range   Hgb A1c MFr Bld 9.3 (H) 4.8 - 5.6 %    Comment:          Prediabetes: 5.7 - 6.4          Diabetes: >6.4          Glycemic control for adults with diabetes: <7.0    Est. average glucose Bld gHb Est-mCnc 220 mg/dL  TSH      Status: None   Collection Time: 11/09/23 10:42 AM  Result Value Ref Range   TSH 0.891 0.450 - 4.500 uIU/mL  PSA     Status: None   Collection Time: 11/09/23 10:42 AM  Result Value Ref Range   Prostate Specific Ag, Serum 0.8 0.0 - 4.0 ng/mL    Comment: Roche ECLIA methodology. According to the American Urological Association, Serum PSA should decrease and remain at undetectable levels after radical prostatectomy. The AUA defines biochemical recurrence as an initial PSA value 0.2 ng/mL or greater followed by a subsequent confirmatory PSA value 0.2 ng/mL or greater. Values obtained with different assay methods or kits cannot be used interchangeably. Results cannot be interpreted as absolute evidence of the presence or absence of malignant disease.   POCT CBG (Fasting - Glucose)     Status: Abnormal   Collection Time: 12/16/23  3:23 PM  Result Value Ref Range   Glucose Fasting, POC 294 (A) 70 - 99 mg/dL  Basic metabolic panel with GFR     Status: Abnormal   Collection Time: 12/16/23  4:28 PM  Result Value Ref Range   Glucose 302 (H) 70 - 99 mg/dL   BUN 26 (H) 6 - 24 mg/dL   Creatinine, Ser 8.83 0.76 - 1.27 mg/dL   eGFR 74 >40 fO/fpw/8.26   BUN/Creatinine Ratio 22 (H) 9 - 20   Sodium 133 (L) 134 - 144  mmol/L   Potassium 4.2 3.5 - 5.2 mmol/L   Chloride 96 96 - 106 mmol/L   CO2 22 20 - 29 mmol/L   Calcium  10.3 (H) 8.7 - 10.2 mg/dL  POCT CBG (Fasting - Glucose)     Status: Abnormal   Collection Time: 12/27/23  3:04 PM  Result Value Ref Range   Glucose Fasting, POC 177 (A) 70 - 99 mg/dL  POC CREATINE & ALBUMIN,URINE     Status: Abnormal   Collection Time: 12/27/23  3:46 PM  Result Value Ref Range   Microalbumin Ur, POC 150 mg/L   Creatinine, POC 100 mg/dL   Albumin/Creatinine Ratio, Urine, POC >300   Basic metabolic panel with GFR     Status: Abnormal   Collection Time: 12/27/23  3:54 PM  Result Value Ref Range   Glucose 199 (H) 70 - 99 mg/dL   BUN 29 (H) 6 - 24 mg/dL    Creatinine, Ser 8.73 0.76 - 1.27 mg/dL   eGFR 67 >40 fO/fpw/8.26   BUN/Creatinine Ratio 23 (H) 9 - 20   Sodium 132 (L) 134 - 144 mmol/L   Potassium 3.8 3.5 - 5.2 mmol/L   Chloride 94 (L) 96 - 106 mmol/L   CO2 21 20 - 29 mmol/L   Calcium  10.4 (H) 8.7 - 10.2 mg/dL  POCT CBG (Fasting - Glucose)     Status: Abnormal   Collection Time: 01/10/24  3:13 PM  Result Value Ref Range   Glucose Fasting, POC 280 (A) 70 - 99 mg/dL  POCT Urinalysis Dipstick (18997)     Status: Abnormal   Collection Time: 01/10/24  3:52 PM  Result Value Ref Range   Color, UA Yellow    Clarity, UA Clear    Glucose, UA Positive (A) Negative   Bilirubin, UA Negative    Ketones, UA Negative    Spec Grav, UA 1.020 1.010 - 1.025   Blood, UA Negative    pH, UA 5.5 5.0 - 8.0   Protein, UA Positive (A) Negative   Urobilinogen, UA 0.2 0.2 or 1.0 E.U./dL   Nitrite, UA Negative    Leukocytes, UA Negative Negative   Appearance Clear    Odor Yes       Assessment & Plan:  Stop glipizde 5 mg daily. Start Jardiance  25 mg daily. Increase Insulin  to 40 units daily; increase by 5 units as needed for CBG >150. Switch to crestor  20 mg daily. Continue other medications as prescribed. Check UA today. Discussed future endocrinology referral as needed. Problem List Items Addressed This Visit     Hypertriglyceridemia   Relevant Medications   rosuvastatin  (CRESTOR ) 20 MG tablet   Combined hyperlipidemia associated with type 2 diabetes mellitus (HCC)   Relevant Medications   empagliflozin  (JARDIANCE ) 25 MG TABS tablet   rosuvastatin  (CRESTOR ) 20 MG tablet   Overweight with body mass index (BMI) of 29 to 29.9 in adult   Hypertension associated with diabetes (HCC)   Relevant Medications   empagliflozin  (JARDIANCE ) 25 MG TABS tablet   rosuvastatin  (CRESTOR ) 20 MG tablet   Type 2 diabetes mellitus with hyperglycemia, with long-term current use of insulin  (HCC) - Primary   Relevant Medications   empagliflozin  (JARDIANCE ) 25 MG TABS  tablet   rosuvastatin  (CRESTOR ) 20 MG tablet   Other Relevant Orders   POCT CBG (Fasting - Glucose) (Completed)   POCT Urinalysis Dipstick (18997) (Completed)    Return in about 2 weeks (around 01/24/2024).   Total time spent: 25 minutes. This time  includes review of previous notes and results and patient face to face interaction during today's visit.    FERNAND FREDY RAMAN, MD  01/10/2024   This document may have been prepared by [facility] Voice Recognition software and as such may include unintentional dictation errors.

## 2024-01-10 NOTE — Patient Instructions (Addendum)
 Stop glipizide  5 mg daily. Start Jardiance  25 mg daily in morning. Continue metformin  and ozempic . Increase insulin  by 5 units twice day for blood sugars >150  **Stop at 50 units twice a day. Discussed future endocrinology referral. Switch from Lipitor 10 mg to Crestor  20 mg daily.

## 2024-01-24 ENCOUNTER — Encounter: Payer: Self-pay | Admitting: Internal Medicine

## 2024-01-24 ENCOUNTER — Ambulatory Visit: Admitting: Internal Medicine

## 2024-01-24 VITALS — BP 148/86 | HR 85 | Ht 74.0 in | Wt 228.0 lb

## 2024-01-24 DIAGNOSIS — I152 Hypertension secondary to endocrine disorders: Secondary | ICD-10-CM

## 2024-01-24 DIAGNOSIS — E663 Overweight: Secondary | ICD-10-CM | POA: Diagnosis not present

## 2024-01-24 DIAGNOSIS — E1159 Type 2 diabetes mellitus with other circulatory complications: Secondary | ICD-10-CM | POA: Diagnosis not present

## 2024-01-24 DIAGNOSIS — E782 Mixed hyperlipidemia: Secondary | ICD-10-CM

## 2024-01-24 DIAGNOSIS — Z6829 Body mass index (BMI) 29.0-29.9, adult: Secondary | ICD-10-CM

## 2024-01-24 DIAGNOSIS — Z794 Long term (current) use of insulin: Secondary | ICD-10-CM

## 2024-01-24 DIAGNOSIS — E1165 Type 2 diabetes mellitus with hyperglycemia: Secondary | ICD-10-CM

## 2024-01-24 DIAGNOSIS — J069 Acute upper respiratory infection, unspecified: Secondary | ICD-10-CM | POA: Diagnosis not present

## 2024-01-24 DIAGNOSIS — E1169 Type 2 diabetes mellitus with other specified complication: Secondary | ICD-10-CM

## 2024-01-24 LAB — POCT XPERT XPRESS SARS COVID-2/FLU/RSV
FLU A: NEGATIVE
FLU B: NEGATIVE
RSV RNA, PCR: NEGATIVE
SARS Coronavirus 2: NEGATIVE

## 2024-01-24 LAB — POCT CBG (FASTING - GLUCOSE)-MANUAL ENTRY: Glucose Fasting, POC: 295 mg/dL — AB (ref 70–99)

## 2024-01-24 MED ORDER — BLOOD GLUCOSE MONITOR KIT: PACK | 0 refills | Status: AC

## 2024-01-24 MED ORDER — LISINOPRIL 20 MG PO TABS: 20.0000 mg | ORAL_TABLET | Freq: Every day | ORAL | 0 refills | Status: DC

## 2024-01-24 MED ORDER — BENZONATATE 200 MG PO CAPS: 200.0000 mg | ORAL_CAPSULE | Freq: Three times a day (TID) | ORAL | 0 refills | Status: DC | PRN

## 2024-01-24 MED ORDER — INSULIN LISPRO 100 UNIT/ML IJ SOLN: INTRAMUSCULAR | 3 refills | Status: DC

## 2024-01-24 NOTE — Progress Notes (Signed)
 "  Established Patient Office Visit  Subjective:  Patient ID: Gregory Bowman, male    DOB: Mar 20, 1970  Age: 53 y.o. MRN: [id number]  Chief Complaint  Patient presents with   Follow-up    2 week follow up    Patient is here today for follow up. He reports feeling under the weather today. He reports he has been sick since 01/16/24 and it worsened over the next few days. He repots having cough, congestion, sore throat. Denies fever and chills. He has been taking Dayquil and Nyquil without symptom relief. Denies any known sick exposure. Will test for covid flu today. Start Benzonate pearls to take as needed.   He is up to 50 units twice daily with his 75/25 insulin  and his blood sugars have been anywhere from 94-489. But on average it runs mainly 200-300's. Will increase Humalog  75/25 to 55 units twice daily and add on Humalog  sliding scale as directed with meals. Patient reports glucose meter has not been replaced in quite some time. Will send order for replacement glucose meter and supplies to make sure patients blood sugars as accurate. Reinforced staying hydrated and eating healthy diet.  Patient BP is elevated today. He has been taking his medications as prescribed without side effects. Will add on lisinopril  20 mg to take in addition to his current BP medication and will send in new prescription when he gets near the end of his current prescription bottle at home.  He denies any chest pain, shortness of breath, palpitations at this time.    No other concerns at this time.   Past Medical History:  Diagnosis Date   Allergic rhinitis    Diabetes type 2, controlled (HCC)    Family history of colon cancer    Hepatic steatosis    Hypercalcemia    Hypertension    Hypertriglyceridemia    Recurrent pancreatitis    Vitamin D deficiency     Past Surgical History:  Procedure Laterality Date   COLONOSCOPY N/A 10/10/2021   Procedure: COLONOSCOPY;  Surgeon: Jinny Carmine, MD;  Location:  [facility];  Service: Endoscopy;  Laterality: N/A;   COLONOSCOPY WITH PROPOFOL  N/A 06/18/2020   Procedure: COLONOSCOPY WITH PROPOFOL ;  Surgeon: Janalyn Keene NOVAK, MD;  Location: [facility];  Service: Endoscopy;  Laterality: N/A;   LAPAROSCOPIC CHOLECYSTECTOMY  05/09/14   Dr. Wonda GLENWOOD [facility]   POLYPECTOMY  10/10/2021   Procedure: POLYPECTOMY;  Surgeon: Jinny Carmine, MD;  Location: [facility];  Service: Endoscopy;;    Social History   Socioeconomic History   Marital status: Single    Spouse name: Not on file   Number of children: Not on file   Years of education: Not on file   Highest education level: Not on file  Occupational History   Not on file  Tobacco Use   Smoking status: Never   Smokeless tobacco: Never  Vaping Use   Vaping status: Never Used  Substance and Sexual Activity   Alcohol use: Yes    Alcohol/week: 0.0 standard drinks of alcohol    Comment: occassionally    Drug use: No   Sexual activity: Not on file  Other Topics Concern   Not on file  Social History Narrative   Not on file   Social Drivers of Health   Tobacco Use: Low Risk (01/24/2024)   Patient History    Smoking Tobacco Use: Never    Smokeless Tobacco Use: Never    Passive Exposure: Not on file  Financial Resource Strain: Not on file  Food Insecurity: Not on file  Transportation Needs: Not on file  Physical Activity: Not on file  Stress: Not on file  Social Connections: Not on file  Intimate Partner Violence: Not on file  Depression (PHQ2-9): Low Risk (11/09/2023)   Depression (PHQ2-9)    PHQ-2 Score: 0  Alcohol Screen: Not on file  Housing: Not on file  Utilities: Not on file  Health Literacy: Not on file    Family History  Problem Relation Age of Onset   Diabetes Mother    Lymphoma Mother    COPD Mother    Stroke Father    COPD Father    Colon cancer Father     Allergies[1]  Show/hide medication list[2]  Review of Systems  Constitutional: Negative.   Negative for chills, fever and malaise/fatigue.  HENT:  Positive for congestion and sore throat.   Eyes: Negative.  Negative for blurred vision and pain.  Respiratory:  Positive for cough. Negative for shortness of breath.   Cardiovascular: Negative.  Negative for chest pain, palpitations and leg swelling.  Gastrointestinal: Negative.  Negative for abdominal pain, blood in stool, constipation, diarrhea, heartburn, melena, nausea and vomiting.  Genitourinary: Negative.  Negative for dysuria, flank pain, frequency and urgency.  Musculoskeletal: Negative.  Negative for joint pain and myalgias.  Skin: Negative.   Neurological: Negative.  Negative for dizziness, tingling, sensory change, weakness and headaches.  Endo/Heme/Allergies: Negative.   Psychiatric/Behavioral: Negative.  Negative for depression and suicidal ideas. The patient is not nervous/anxious.        Objective:   BP (!) 148/86   Pulse 85   Ht 6' 2 (1.88 m)   Wt 228 lb (103.4 kg)   SpO2 97%   BMI 29.27 kg/m   Vitals:   01/24/24 1454  BP: (!) 148/86  Pulse: 85  Height: 6' 2 (1.88 m)  Weight: 228 lb (103.4 kg)  SpO2: 97%  BMI (Calculated): 29.26    Physical Exam Vitals and nursing note reviewed.  Constitutional:      General: He is not in acute distress.    Appearance: Normal appearance. He is not ill-appearing.  HENT:     Head: Normocephalic and atraumatic.     Nose: Nose normal.     Mouth/Throat:     Mouth: Mucous membranes are moist.     Pharynx: Oropharynx is clear.  Eyes:     Conjunctiva/sclera: Conjunctivae normal.     Pupils: Pupils are equal, round, and reactive to light.  Cardiovascular:     Rate and Rhythm: Normal rate and regular rhythm.     Pulses: Normal pulses.     Heart sounds: Normal heart sounds.  Pulmonary:     Effort: Pulmonary effort is normal.     Breath sounds: Normal breath sounds. No wheezing or rhonchi.  Abdominal:     General: Bowel sounds are normal. There is no distension.      Palpations: Abdomen is soft.     Tenderness: There is no abdominal tenderness.  Musculoskeletal:        General: Normal range of motion.     Cervical back: Normal range of motion and neck supple.     Right lower leg: No edema.     Left lower leg: No edema.  Skin:    General: Skin is warm and dry.     Capillary Refill: Capillary refill takes less than 2 seconds.  Neurological:     General: No focal deficit  present.     Mental Status: He is alert and oriented to person, place, and time.     Sensory: No sensory deficit.     Motor: No weakness.  Psychiatric:        Mood and Affect: Mood normal.        Behavior: Behavior normal.        Judgment: Judgment normal.      Results for orders placed or performed in visit on 01/24/24  POCT CBG (Fasting - Glucose)  Result Value Ref Range   Glucose Fasting, POC 295 (A) 70 - 99 mg/dL    Recent Results (from the past 2160 hours)  CMP14+EGFR     Status: Abnormal   Collection Time: 11/09/23 10:42 AM  Result Value Ref Range   Glucose 268 (H) 70 - 99 mg/dL   BUN 22 6 - 24 mg/dL   Creatinine, Ser 8.95 0.76 - 1.27 mg/dL   eGFR 85 >40 fO/fpw/8.26   BUN/Creatinine Ratio 21 (H) 9 - 20   Sodium 133 (L) 134 - 144 mmol/L   Potassium 4.5 3.5 - 5.2 mmol/L   Chloride 95 (L) 96 - 106 mmol/L   CO2 22 20 - 29 mmol/L   Calcium  10.2 8.7 - 10.2 mg/dL   Total Protein 7.6 6.0 - 8.5 g/dL   Albumin 4.8 3.8 - 4.9 g/dL   Globulin, Total 2.8 1.5 - 4.5 g/dL   Bilirubin Total 0.5 0.0 - 1.2 mg/dL   Alkaline Phosphatase 93 47 - 123 IU/L   AST 27 0 - 40 IU/L   ALT 45 (H) 0 - 44 IU/L  Lipid Profile     Status: Abnormal   Collection Time: 11/09/23 10:42 AM  Result Value Ref Range   Cholesterol, Total 216 (H) 100 - 199 mg/dL   Triglycerides 357 (HH) 0 - 149 mg/dL   HDL 28 (L) >60 mg/dL   VLDL Cholesterol Cal 104 (H) 5 - 40 mg/dL   LDL Chol Calc (NIH) 84 0 - 99 mg/dL   Chol/HDL Ratio 7.7 (H) 0.0 - 5.0 ratio    Comment:                                   T.  Chol/HDL Ratio                                             Men  Women                               1/2 Avg.Risk  3.4    3.3                                   Avg.Risk  5.0    4.4                                2X Avg.Risk  9.6    7.1                                3X Avg.Risk 23.4   11.0  Hemoglobin A1c     Status: Abnormal   Collection Time: 11/09/23 10:42 AM  Result Value Ref Range   Hgb A1c MFr Bld 9.3 (H) 4.8 - 5.6 %    Comment:          Prediabetes: 5.7 - 6.4          Diabetes: >6.4          Glycemic control for adults with diabetes: <7.0    Est. average glucose Bld gHb Est-mCnc 220 mg/dL  TSH     Status: None   Collection Time: 11/09/23 10:42 AM  Result Value Ref Range   TSH 0.891 0.450 - 4.500 uIU/mL  PSA     Status: None   Collection Time: 11/09/23 10:42 AM  Result Value Ref Range   Prostate Specific Ag, Serum 0.8 0.0 - 4.0 ng/mL    Comment: Roche ECLIA methodology. According to the American Urological Association, Serum PSA should decrease and remain at undetectable levels after radical prostatectomy. The AUA defines biochemical recurrence as an initial PSA value 0.2 ng/mL or greater followed by a subsequent confirmatory PSA value 0.2 ng/mL or greater. Values obtained with different assay methods or kits cannot be used interchangeably. Results cannot be interpreted as absolute evidence of the presence or absence of malignant disease.   POCT CBG (Fasting - Glucose)     Status: Abnormal   Collection Time: 12/16/23  3:23 PM  Result Value Ref Range   Glucose Fasting, POC 294 (A) 70 - 99 mg/dL  Basic metabolic panel with GFR     Status: Abnormal   Collection Time: 12/16/23  4:28 PM  Result Value Ref Range   Glucose 302 (H) 70 - 99 mg/dL   BUN 26 (H) 6 - 24 mg/dL   Creatinine, Ser 8.83 0.76 - 1.27 mg/dL   eGFR 74 >40 fO/fpw/8.26   BUN/Creatinine Ratio 22 (H) 9 - 20   Sodium 133 (L) 134 - 144 mmol/L   Potassium 4.2 3.5 - 5.2 mmol/L   Chloride 96 96 - 106 mmol/L    CO2 22 20 - 29 mmol/L   Calcium  10.3 (H) 8.7 - 10.2 mg/dL  POCT CBG (Fasting - Glucose)     Status: Abnormal   Collection Time: 12/27/23  3:04 PM  Result Value Ref Range   Glucose Fasting, POC 177 (A) 70 - 99 mg/dL  POC CREATINE & ALBUMIN,URINE     Status: Abnormal   Collection Time: 12/27/23  3:46 PM  Result Value Ref Range   Microalbumin Ur, POC 150 mg/L   Creatinine, POC 100 mg/dL   Albumin/Creatinine Ratio, Urine, POC >300   Basic metabolic panel with GFR     Status: Abnormal   Collection Time: 12/27/23  3:54 PM  Result Value Ref Range   Glucose 199 (H) 70 - 99 mg/dL   BUN 29 (H) 6 - 24 mg/dL   Creatinine, Ser 8.73 0.76 - 1.27 mg/dL   eGFR 67 >40 fO/fpw/8.26   BUN/Creatinine Ratio 23 (H) 9 - 20   Sodium 132 (L) 134 - 144 mmol/L   Potassium 3.8 3.5 - 5.2 mmol/L   Chloride 94 (L) 96 - 106 mmol/L   CO2 21 20 - 29 mmol/L   Calcium  10.4 (H) 8.7 - 10.2 mg/dL  POCT CBG (Fasting - Glucose)     Status: Abnormal   Collection Time: 01/10/24  3:13 PM  Result Value Ref Range   Glucose Fasting, POC 280 (A) 70 - 99 mg/dL  POCT Urinalysis  Dipstick [phone])     Status: Abnormal   Collection Time: 01/10/24  3:52 PM  Result Value Ref Range   Color, UA Yellow    Clarity, UA Clear    Glucose, UA Positive (A) Negative   Bilirubin, UA Negative    Ketones, UA Negative    Spec Grav, UA 1.020 1.010 - 1.025   Blood, UA Negative    pH, UA 5.5 5.0 - 8.0   Protein, UA Positive (A) Negative   Urobilinogen, UA 0.2 0.2 or 1.0 E.U./dL   Nitrite, UA Negative    Leukocytes, UA Negative Negative   Appearance Clear    Odor Yes   POCT CBG (Fasting - Glucose)     Status: Abnormal   Collection Time: 01/24/24  2:58 PM  Result Value Ref Range   Glucose Fasting, POC 295 (A) 70 - 99 mg/dL      Assessment & Plan:  Start benzonate pearls as needed for cough. Start Humalog  75/25 at 55 units twice daily. Start Humalog  sliding scale with meals as prescribed. Add on lisinopril  20 mg daily. Swab covid, flu  today; will notify patient of results. Will send in antibiotics for URI if swabs are negative as patient has been symptomatic for 8 days without improvement. Problem List Items Addressed This Visit       Cardiovascular and Mediastinum   Hypertension associated with diabetes (HCC) - Primary   Relevant Medications   lisinopril  (ZESTRIL ) 20 MG tablet   insulin  lispro (HUMALOG ) 100 UNIT/ML injection     Respiratory   Upper respiratory tract infection   Relevant Medications   benzonatate  (TESSALON ) 200 MG capsule     Endocrine   Combined hyperlipidemia associated with type 2 diabetes mellitus (HCC)   Relevant Medications   lisinopril  (ZESTRIL ) 20 MG tablet   insulin  lispro (HUMALOG ) 100 UNIT/ML injection   Type 2 diabetes mellitus with hyperglycemia, with long-term current use of insulin  (HCC)   Relevant Medications   lisinopril  (ZESTRIL ) 20 MG tablet   insulin  lispro (HUMALOG ) 100 UNIT/ML injection   blood glucose meter kit and supplies KIT   Other Relevant Orders   POCT CBG (Fasting - Glucose) (Completed)     Other   Overweight with body mass index (BMI) of 29 to 29.9 in adult    Return in about 10 days (around 02/03/2024).   Total time spent: 25 minutes. This time includes review of previous notes and results and patient face to face interaction during today's visit.    FERNAND FREDY RAMAN, MD  01/24/2024   This document may have been prepared by [facility] Voice Recognition software and as such may include unintentional dictation errors.     [1] No Known Allergies [2]  Outpatient Medications Prior to Visit  Medication Sig   amLODipine  (NORVASC ) 10 MG tablet Take 1 tablet (10 mg total) by mouth daily.   azelastine  (ASTELIN ) 0.1 % nasal spray Place 2 sprays into both nostrils 2 (two) times daily. Use in each nostril as directed   Cinnamon 500 MG TABS Take by mouth.   empagliflozin  (JARDIANCE ) 25 MG TABS tablet Take 1 tablet (25 mg total) by mouth daily before breakfast.    fenofibrate  (TRICOR ) 145 MG tablet Take 1 tablet (145 mg total) by mouth daily.   Insulin  Lispro Prot & Lispro (HUMALOG  75/25 MIX) (75-25) 100 UNIT/ML Kwikpen Inject 25 Units into the skin in the morning and at bedtime. (Patient taking differently: Inject 50 Units into the skin in the morning and at bedtime.)  lisinopril -hydrochlorothiazide  (ZESTORETIC ) 20-12.5 MG tablet Take 1 tablet by mouth daily.   metFORMIN  (GLUCOPHAGE ) 1000 MG tablet Take 1 tablet (1,000 mg total) by mouth 2 (two) times daily with a meal.   metoprolol  tartrate (LOPRESSOR ) 25 MG tablet Take 1 tablet (25 mg total) by mouth 2 (two) times daily.   Omega-3 Fatty Acids (FISH OIL TRIPLE STRENGTH) 1400 MG CAPS Take by mouth.   rosuvastatin  (CRESTOR ) 20 MG tablet Take 1 tablet (20 mg total) by mouth daily.   Semaglutide , 1 MG/DOSE, 4 MG/3ML SOPN Inject 1 mg into the skin once a week. (Patient taking differently: Inject 0.5 mg into the skin once a week.)   [DISCONTINUED] glipiZIDE  (GLUCOTROL  XL) 5 MG 24 hr tablet 2 tablets in the morning and 1 in the evening   No facility-administered medications prior to visit.   "

## 2024-01-24 NOTE — Addendum Note (Signed)
 Addended byBETHA ORION STABS on: 01/24/2024 04:21 PM   Modules accepted: Orders

## 2024-01-25 MED ORDER — AMOXICILLIN-POT CLAVULANATE 875-125 MG PO TABS: 1.0000 | ORAL_TABLET | Freq: Two times a day (BID) | ORAL | 0 refills | Status: DC

## 2024-01-25 NOTE — Addendum Note (Signed)
 Addended by: FERNAND FREDY RAMAN on: 01/25/2024 09:02 AM   Modules accepted: Orders

## 2024-02-07 ENCOUNTER — Ambulatory Visit: Admitting: Internal Medicine

## 2024-02-07 ENCOUNTER — Encounter: Payer: Self-pay | Admitting: Internal Medicine

## 2024-02-07 VITALS — BP 118/76 | HR 77 | Ht 74.0 in | Wt 225.6 lb

## 2024-02-07 DIAGNOSIS — E1159 Type 2 diabetes mellitus with other circulatory complications: Secondary | ICD-10-CM

## 2024-02-07 DIAGNOSIS — I152 Hypertension secondary to endocrine disorders: Secondary | ICD-10-CM | POA: Diagnosis not present

## 2024-02-07 DIAGNOSIS — Z1329 Encounter for screening for other suspected endocrine disorder: Secondary | ICD-10-CM

## 2024-02-07 DIAGNOSIS — E1165 Type 2 diabetes mellitus with hyperglycemia: Secondary | ICD-10-CM

## 2024-02-07 DIAGNOSIS — E782 Mixed hyperlipidemia: Secondary | ICD-10-CM | POA: Diagnosis not present

## 2024-02-07 DIAGNOSIS — E1169 Type 2 diabetes mellitus with other specified complication: Secondary | ICD-10-CM

## 2024-02-07 DIAGNOSIS — Z6828 Body mass index (BMI) 28.0-28.9, adult: Secondary | ICD-10-CM | POA: Diagnosis not present

## 2024-02-07 DIAGNOSIS — E663 Overweight: Secondary | ICD-10-CM | POA: Diagnosis not present

## 2024-02-07 DIAGNOSIS — Z794 Long term (current) use of insulin: Secondary | ICD-10-CM

## 2024-02-07 LAB — POCT CBG (FASTING - GLUCOSE)-MANUAL ENTRY: Glucose Fasting, POC: 245 mg/dL — AB (ref 70–99)

## 2024-02-07 NOTE — Progress Notes (Signed)
 "  Established Patient Office Visit  Subjective:  Patient ID: Gregory Bowman, male    DOB: 06/12/1970  Age: 54 y.o. MRN: [id number]  Chief Complaint  Patient presents with   Follow-up    10 day follow up    Patient is here today for follow up. He reports feeling fairly well today.  His random glucose check remains high today. Fasting blood sugars range anywhere from 100- 350's. He reports using 55 units of 75/25 insulin  twice daily. He reports using the sliding scale with dinner and states he's needed anywhere from 5-20 units. Recommend patient start doing sliding scale at breakfast and dinner. He reports not having reliable place to keep his insulin  at work to use at lunch time meal. Will also send endocrinology referral in the mean time. He has eaten lunch today and will return for fasting labs. Advised patient to start recording food journal on the days his blood sugar is above 200. Keep trying to be mindful of carbohydrates and concentrated sweets. He reports he has seen a Nutritionist in the past when he was initially diagnosed as diabetic. Patient reports having 5 injections at home of the Ozempic  0.5 mg weekly injection.  He reports resolution in his sinus infection after completing his antibiotics as prescribed.  He reports occasional dizziness that started yesterday. His BP is within normal limits at this time. He has taken his medications as prescribed.     No other concerns at this time.   Past Medical History:  Diagnosis Date   Allergic rhinitis    Diabetes type 2, controlled (HCC)    Family history of colon cancer    Hepatic steatosis    Hypercalcemia    Hypertension    Hypertriglyceridemia    Recurrent pancreatitis    Vitamin D deficiency     Past Surgical History:  Procedure Laterality Date   COLONOSCOPY N/A 10/10/2021   Procedure: COLONOSCOPY;  Surgeon: Jinny Carmine, MD;  Location: [facility];  Service: Endoscopy;  Laterality: N/A;   COLONOSCOPY WITH  PROPOFOL  N/A 06/18/2020   Procedure: COLONOSCOPY WITH PROPOFOL ;  Surgeon: Janalyn Keene NOVAK, MD;  Location: [facility];  Service: Endoscopy;  Laterality: N/A;   LAPAROSCOPIC CHOLECYSTECTOMY  05/09/14   Dr. Wonda GLENWOOD [facility]   POLYPECTOMY  10/10/2021   Procedure: POLYPECTOMY;  Surgeon: Jinny Carmine, MD;  Location: [facility];  Service: Endoscopy;;    Social History   Socioeconomic History   Marital status: Single    Spouse name: Not on file   Number of children: Not on file   Years of education: Not on file   Highest education level: Not on file  Occupational History   Not on file  Tobacco Use   Smoking status: Never   Smokeless tobacco: Never  Vaping Use   Vaping status: Never Used  Substance and Sexual Activity   Alcohol use: Yes    Alcohol/week: 0.0 standard drinks of alcohol    Comment: occassionally    Drug use: No   Sexual activity: Not on file  Other Topics Concern   Not on file  Social History Narrative   Not on file   Social Drivers of Health   Tobacco Use: Low Risk (02/07/2024)   Patient History    Smoking Tobacco Use: Never    Smokeless Tobacco Use: Never    Passive Exposure: Not on file  Financial Resource Strain: Not on file  Food Insecurity: Not on file  Transportation Needs: Not on file  Physical Activity: Not on file  Stress: Not on file  Social Connections: Not on file  Intimate Partner Violence: Not on file  Depression (PHQ2-9): Low Risk (11/09/2023)   Depression (PHQ2-9)    PHQ-2 Score: 0  Alcohol Screen: Not on file  Housing: Not on file  Utilities: Not on file  Health Literacy: Not on file    Family History  Problem Relation Age of Onset   Diabetes Mother    Lymphoma Mother    COPD Mother    Stroke Father    COPD Father    Colon cancer Father     Allergies[1]  Show/hide medication list[2]  Review of Systems  Constitutional: Negative.  Negative for chills, fever and malaise/fatigue.  HENT: Negative.  Negative for  congestion and sore throat.   Eyes: Negative.  Negative for blurred vision and pain.  Respiratory: Negative.  Negative for cough and shortness of breath.   Cardiovascular: Negative.  Negative for chest pain, palpitations and leg swelling.  Gastrointestinal: Negative.  Negative for abdominal pain, blood in stool, constipation, diarrhea, heartburn, melena, nausea and vomiting.  Genitourinary: Negative.  Negative for dysuria, flank pain, frequency and urgency.  Musculoskeletal: Negative.  Negative for joint pain and myalgias.  Skin: Negative.   Neurological:  Positive for dizziness. Negative for tingling, sensory change, weakness and headaches.  Endo/Heme/Allergies: Negative.   Psychiatric/Behavioral: Negative.  Negative for depression and suicidal ideas. The patient is not nervous/anxious.        Objective:   BP 118/76   Pulse 77   Ht 6' 2 (1.88 m)   Wt 225 lb 9.6 oz (102.3 kg)   SpO2 99%   BMI 28.97 kg/m   Vitals:   02/07/24 1441  BP: 118/76  Pulse: 77  Height: 6' 2 (1.88 m)  Weight: 225 lb 9.6 oz (102.3 kg)  SpO2: 99%  BMI (Calculated): 28.95    Physical Exam Vitals and nursing note reviewed.  Constitutional:      General: He is not in acute distress.    Appearance: Normal appearance. He is not ill-appearing.  HENT:     Head: Normocephalic and atraumatic.     Nose: Nose normal.     Mouth/Throat:     Mouth: Mucous membranes are moist.     Pharynx: Oropharynx is clear.  Eyes:     Conjunctiva/sclera: Conjunctivae normal.     Pupils: Pupils are equal, round, and reactive to light.  Cardiovascular:     Rate and Rhythm: Normal rate and regular rhythm.     Pulses: Normal pulses.     Heart sounds: Normal heart sounds.  Pulmonary:     Effort: Pulmonary effort is normal.     Breath sounds: Normal breath sounds. No wheezing or rhonchi.  Abdominal:     General: Bowel sounds are normal. There is no distension.     Palpations: Abdomen is soft.     Tenderness: There is  no abdominal tenderness.  Musculoskeletal:        General: Normal range of motion.     Cervical back: Normal range of motion and neck supple.     Right lower leg: No edema.     Left lower leg: No edema.  Skin:    General: Skin is warm and dry.     Capillary Refill: Capillary refill takes less than 2 seconds.  Neurological:     General: No focal deficit present.     Mental Status: He is alert and oriented to person, place,  and time.     Sensory: No sensory deficit.     Motor: No weakness.  Psychiatric:        Mood and Affect: Mood normal.        Behavior: Behavior normal.        Judgment: Judgment normal.      Results for orders placed or performed in visit on 02/07/24  POCT CBG (Fasting - Glucose)  Result Value Ref Range   Glucose Fasting, POC 245 (A) 70 - 99 mg/dL    Recent Results (from the past 2160 hours)  POCT CBG (Fasting - Glucose)     Status: Abnormal   Collection Time: 12/16/23  3:23 PM  Result Value Ref Range   Glucose Fasting, POC 294 (A) 70 - 99 mg/dL  Basic metabolic panel with GFR     Status: Abnormal   Collection Time: 12/16/23  4:28 PM  Result Value Ref Range   Glucose 302 (H) 70 - 99 mg/dL   BUN 26 (H) 6 - 24 mg/dL   Creatinine, Ser 8.83 0.76 - 1.27 mg/dL   eGFR 74 >40 fO/fpw/8.26   BUN/Creatinine Ratio 22 (H) 9 - 20   Sodium 133 (L) 134 - 144 mmol/L   Potassium 4.2 3.5 - 5.2 mmol/L   Chloride 96 96 - 106 mmol/L   CO2 22 20 - 29 mmol/L   Calcium  10.3 (H) 8.7 - 10.2 mg/dL  POCT CBG (Fasting - Glucose)     Status: Abnormal   Collection Time: 12/27/23  3:04 PM  Result Value Ref Range   Glucose Fasting, POC 177 (A) 70 - 99 mg/dL  POC CREATINE & ALBUMIN,URINE     Status: Abnormal   Collection Time: 12/27/23  3:46 PM  Result Value Ref Range   Microalbumin Ur, POC 150 mg/L   Creatinine, POC 100 mg/dL   Albumin/Creatinine Ratio, Urine, POC >300   Basic metabolic panel with GFR     Status: Abnormal   Collection Time: 12/27/23  3:54 PM  Result Value  Ref Range   Glucose 199 (H) 70 - 99 mg/dL   BUN 29 (H) 6 - 24 mg/dL   Creatinine, Ser 8.73 0.76 - 1.27 mg/dL   eGFR 67 >40 fO/fpw/8.26   BUN/Creatinine Ratio 23 (H) 9 - 20   Sodium 132 (L) 134 - 144 mmol/L   Potassium 3.8 3.5 - 5.2 mmol/L   Chloride 94 (L) 96 - 106 mmol/L   CO2 21 20 - 29 mmol/L   Calcium  10.4 (H) 8.7 - 10.2 mg/dL  POCT CBG (Fasting - Glucose)     Status: Abnormal   Collection Time: 01/10/24  3:13 PM  Result Value Ref Range   Glucose Fasting, POC 280 (A) 70 - 99 mg/dL  POCT Urinalysis Dipstick (18997)     Status: Abnormal   Collection Time: 01/10/24  3:52 PM  Result Value Ref Range   Color, UA Yellow    Clarity, UA Clear    Glucose, UA Positive (A) Negative   Bilirubin, UA Negative    Ketones, UA Negative    Spec Grav, UA 1.020 1.010 - 1.025   Blood, UA Negative    pH, UA 5.5 5.0 - 8.0   Protein, UA Positive (A) Negative   Urobilinogen, UA 0.2 0.2 or 1.0 E.U./dL   Nitrite, UA Negative    Leukocytes, UA Negative Negative   Appearance Clear    Odor Yes   POCT CBG (Fasting - Glucose)     Status: Abnormal  Collection Time: 01/24/24  2:58 PM  Result Value Ref Range   Glucose Fasting, POC 295 (A) 70 - 99 mg/dL  POCT XPERT XPRESS SARS COVID-2/FLU/RSV     Status: None   Collection Time: 01/24/24  4:19 PM  Result Value Ref Range   SARS Coronavirus 2 Negative    FLU A Negative    FLU B Negative    RSV RNA, PCR Negative   POCT CBG (Fasting - Glucose)     Status: Abnormal   Collection Time: 02/07/24  2:45 PM  Result Value Ref Range   Glucose Fasting, POC 245 (A) 70 - 99 mg/dL      Assessment & Plan:  Start using sliding scale with breakfast and dinner meals. Continue 75/25 insulin  55 units twice daily. Check routine blood work when patient is fasting. Will send Endocrinology referral. Reinforced healthy diet and exercise as tolerated. Problem List Items Addressed This Visit       Cardiovascular and Mediastinum   Hypertension associated with diabetes (HCC)  - Primary   Relevant Orders   CMP14+EGFR     Endocrine   Combined hyperlipidemia associated with type 2 diabetes mellitus (HCC)   Relevant Orders   Lipid Profile   Type 2 diabetes mellitus with hyperglycemia, with long-term current use of insulin  (HCC)   Relevant Orders   POCT CBG (Fasting - Glucose) (Completed)   Hemoglobin A1c     Other   Thyroid disorder screening   Relevant Orders   TSH   Overweight with body mass index (BMI) of 28 to 28.9 in adult    Return in about 2 weeks (around 02/21/2024).   Total time spent: 25 minutes. This time includes review of previous notes and results and patient face to face interaction during today's visit.    FERNAND FREDY RAMAN, MD  02/07/2024   This document may have been prepared by [facility] Voice Recognition software and as such may include unintentional dictation errors.     [1] No Known Allergies [2]  Outpatient Medications Prior to Visit  Medication Sig   amLODipine  (NORVASC ) 10 MG tablet Take 1 tablet (10 mg total) by mouth daily.   azelastine  (ASTELIN ) 0.1 % nasal spray Place 2 sprays into both nostrils 2 (two) times daily. Use in each nostril as directed   blood glucose meter kit and supplies KIT Dispense based on patient and insurance preference. Use up to four times daily as directed.   Cinnamon 500 MG TABS Take by mouth.   empagliflozin  (JARDIANCE ) 25 MG TABS tablet Take 1 tablet (25 mg total) by mouth daily before breakfast.   fenofibrate  (TRICOR ) 145 MG tablet Take 1 tablet (145 mg total) by mouth daily.   insulin  lispro (HUMALOG ) 100 UNIT/ML injection Administer within 15 minutes of meal times 3 times a day: If before meal blood sugars are: O units <150; 5 units 151-200; 10 units 201-250; 15 units 251-300; 20 units 301-350; 25 units >351   Insulin  Lispro Prot & Lispro (HUMALOG  75/25 MIX) (75-25) 100 UNIT/ML Kwikpen Inject 25 Units into the skin in the morning and at bedtime. (Patient taking differently: Inject 55 Units into the  skin in the morning and at bedtime.)   lisinopril  (ZESTRIL ) 20 MG tablet Take 1 tablet (20 mg total) by mouth daily.   lisinopril -hydrochlorothiazide  (ZESTORETIC ) 20-12.5 MG tablet Take 1 tablet by mouth daily.   metFORMIN  (GLUCOPHAGE ) 1000 MG tablet Take 1 tablet (1,000 mg total) by mouth 2 (two) times daily with a meal.   metoprolol   tartrate (LOPRESSOR ) 25 MG tablet Take 1 tablet (25 mg total) by mouth 2 (two) times daily.   Omega-3 Fatty Acids (FISH OIL TRIPLE STRENGTH) 1400 MG CAPS Take by mouth.   rosuvastatin  (CRESTOR ) 20 MG tablet Take 1 tablet (20 mg total) by mouth daily.   Semaglutide , 1 MG/DOSE, 4 MG/3ML SOPN Inject 1 mg into the skin once a week. (Patient taking differently: Inject 0.5 mg into the skin once a week.)   amoxicillin -clavulanate (AUGMENTIN ) 875-125 MG tablet Take 1 tablet by mouth 2 (two) times daily. (Patient not taking: Reported on 02/07/2024)   benzonatate  (TESSALON ) 200 MG capsule Take 1 capsule (200 mg total) by mouth 3 (three) times daily as needed for cough. (Patient not taking: Reported on 02/07/2024)   No facility-administered medications prior to visit.   "

## 2024-02-09 ENCOUNTER — Other Ambulatory Visit

## 2024-02-09 DIAGNOSIS — E1169 Type 2 diabetes mellitus with other specified complication: Secondary | ICD-10-CM

## 2024-02-09 DIAGNOSIS — I152 Hypertension secondary to endocrine disorders: Secondary | ICD-10-CM

## 2024-02-09 DIAGNOSIS — E1165 Type 2 diabetes mellitus with hyperglycemia: Secondary | ICD-10-CM

## 2024-02-09 DIAGNOSIS — Z1329 Encounter for screening for other suspected endocrine disorder: Secondary | ICD-10-CM

## 2024-02-10 ENCOUNTER — Ambulatory Visit: Payer: Self-pay | Admitting: Internal Medicine

## 2024-02-10 DIAGNOSIS — I152 Hypertension secondary to endocrine disorders: Secondary | ICD-10-CM

## 2024-02-10 DIAGNOSIS — E781 Pure hyperglyceridemia: Secondary | ICD-10-CM

## 2024-02-10 DIAGNOSIS — E1169 Type 2 diabetes mellitus with other specified complication: Secondary | ICD-10-CM

## 2024-02-10 LAB — LIPID PANEL
Chol/HDL Ratio: 7.5 ratio — ABNORMAL HIGH (ref 0.0–5.0)
Cholesterol, Total: 224 mg/dL — ABNORMAL HIGH (ref 100–199)
HDL: 30 mg/dL — ABNORMAL LOW
LDL Chol Calc (NIH): 84 mg/dL (ref 0–99)
Triglycerides: 675 mg/dL (ref 0–149)
VLDL Cholesterol Cal: 110 mg/dL — ABNORMAL HIGH (ref 5–40)

## 2024-02-10 LAB — CMP14+EGFR
ALT: 28 IU/L (ref 0–44)
AST: 24 IU/L (ref 0–40)
Albumin: 4.5 g/dL (ref 3.8–4.9)
Alkaline Phosphatase: 78 IU/L (ref 47–123)
BUN/Creatinine Ratio: 26 — ABNORMAL HIGH (ref 9–20)
BUN: 34 mg/dL — ABNORMAL HIGH (ref 6–24)
Bilirubin Total: 0.2 mg/dL (ref 0.0–1.2)
CO2: 20 mmol/L (ref 20–29)
Calcium: 10.2 mg/dL (ref 8.7–10.2)
Chloride: 98 mmol/L (ref 96–106)
Creatinine, Ser: 1.29 mg/dL — ABNORMAL HIGH (ref 0.76–1.27)
Globulin, Total: 3 g/dL (ref 1.5–4.5)
Glucose: 194 mg/dL — ABNORMAL HIGH (ref 70–99)
Potassium: 4.2 mmol/L (ref 3.5–5.2)
Sodium: 135 mmol/L (ref 134–144)
Total Protein: 7.5 g/dL (ref 6.0–8.5)
eGFR: 65 mL/min/1.73

## 2024-02-10 LAB — TSH: TSH: 0.893 u[IU]/mL (ref 0.450–4.500)

## 2024-02-10 LAB — HEMOGLOBIN A1C
Est. average glucose Bld gHb Est-mCnc: 263 mg/dL
Hgb A1c MFr Bld: 10.8 % — ABNORMAL HIGH (ref 4.8–5.6)

## 2024-02-10 MED ORDER — ROSUVASTATIN CALCIUM 40 MG PO TABS: 40.0000 mg | ORAL_TABLET | Freq: Every day | ORAL | 3 refills | Status: AC

## 2024-02-10 NOTE — Progress Notes (Signed)
 Unable to leave a vm

## 2024-02-25 ENCOUNTER — Other Ambulatory Visit: Payer: Self-pay | Admitting: Internal Medicine

## 2024-02-25 DIAGNOSIS — E1165 Type 2 diabetes mellitus with hyperglycemia: Secondary | ICD-10-CM

## 2024-02-25 MED ORDER — INSULIN LISPRO PROT & LISPRO (75-25 MIX) 100 UNIT/ML KWIKPEN: 55.0000 [IU] | PEN_INJECTOR | Freq: Two times a day (BID) | SUBCUTANEOUS | 3 refills | Status: AC

## 2024-03-09 ENCOUNTER — Ambulatory Visit (INDEPENDENT_AMBULATORY_CARE_PROVIDER_SITE_OTHER)

## 2024-03-09 VITALS — BP 160/80 | HR 87 | Ht 74.0 in | Wt 228.8 lb

## 2024-03-09 DIAGNOSIS — E663 Overweight: Secondary | ICD-10-CM

## 2024-03-09 DIAGNOSIS — I152 Hypertension secondary to endocrine disorders: Secondary | ICD-10-CM

## 2024-03-09 DIAGNOSIS — Z794 Long term (current) use of insulin: Secondary | ICD-10-CM

## 2024-03-09 DIAGNOSIS — Z6829 Body mass index (BMI) 29.0-29.9, adult: Secondary | ICD-10-CM

## 2024-03-09 DIAGNOSIS — E1169 Type 2 diabetes mellitus with other specified complication: Secondary | ICD-10-CM

## 2024-03-09 DIAGNOSIS — E1159 Type 2 diabetes mellitus with other circulatory complications: Secondary | ICD-10-CM

## 2024-03-09 DIAGNOSIS — E782 Mixed hyperlipidemia: Secondary | ICD-10-CM

## 2024-03-09 DIAGNOSIS — E1165 Type 2 diabetes mellitus with hyperglycemia: Secondary | ICD-10-CM

## 2024-03-09 MED ORDER — SEMAGLUTIDE (1 MG/DOSE) 4 MG/3ML ~~LOC~~ SOPN: 1.0000 mg | PEN_INJECTOR | SUBCUTANEOUS | 0 refills | Status: AC

## 2024-03-09 MED ORDER — LISINOPRIL-HYDROCHLOROTHIAZIDE 20-12.5 MG PO TABS: 1.0000 | ORAL_TABLET | Freq: Every day | ORAL | 3 refills | Status: AC

## 2024-03-09 MED ORDER — LISINOPRIL 10 MG PO TABS: 10.0000 mg | ORAL_TABLET | Freq: Every day | ORAL | 3 refills | Status: AC

## 2024-03-09 MED ORDER — INSULIN LISPRO 100 UNIT/ML IJ SOLN: INTRAMUSCULAR | 3 refills | Status: AC

## 2024-03-09 NOTE — Assessment & Plan Note (Signed)
-   Reinforced healthy diet and exercise as tolerated. - Increase sliding scale by 7 units 3 times daily with meals. - Increase Lisinopril  5 mg tablet by taking 4 tablets daily to finish remaining bottle. - Continue other medications as prescribed. - Refills sent. - Provided patient with Endo contact information. - Discussed future dexamethasone suppression test for uncontrolled blood sugars.

## 2024-03-09 NOTE — Progress Notes (Signed)
 "  Established Patient Office Visit  Subjective:  Patient ID: Gregory Bowman, male    DOB: 08-12-1970  Age: 54 y.o. MRN: [id number]  Chief Complaint  Patient presents with   Follow-up    1 month follow up    Patient is here today for his 2 week follow up.  He has been feeling fairly well since last appointment.   He does have additional concerns to discuss today. Patient reports still having elevated blood sugar readings at home. They are on average closer to 200-300 range with occasional 500. Patient reports eating healthy diet and exercising regularly. He denies high intake of carbs and sugars. Patient reports taking insulin  as prescribed and states he is using his sliding scale 3 times a day now. Will increase sliding scale to 7 units based off blood sugar prior to meals. Continue long acting insulin  as prescribed. Reinforced healthy diet and exercise as tolerated. Provided patient with Endocrinology contact information; referral was sent 02/2024. Discussed dexamethasone suppression test when his labs are due to determine if his uncontrolled blood sugars could be cortisol related.  Patient's BP is elevated today. He reports taking his medications as prescribed every morning around 5 am. Patient has medications with him. Reviewed current prescriptions and patient has Zestoretic  10-12.5 mg daily and Lisinopril  10 mg (5 mg tablets) daily. Patient is prescribed Zestoretic  20-12.5 mg daily and Lisinopril  20 mg daily. Advised patient to continue Zestoretic  10-12.5 mg tablet daily. Increase to 20 mg lisinopril  daily by taking 4 tablets of 5 mg prescription to finish current prescription. BP goal reviewed with patient is <130/80. Patient states his insurance copay is high and he has financial difficulties affording his medications and bills if he has frequent doctor appointments. Patient states he is able to check BP at home. Recommend patient contact office in 2 weeks if his BP remains above goal. Will  send in new correct prescription of current medication regimen and increase dose as needed.  Labs are not due today.  He needs refills. Insulin  sent with new sliding scale parameters.  I have reviewed his active problem list, medication list, allergies, family history, social history, health maintenance, notes from last encounter, lab results for his appointment today.      No other concerns at this time.   Past Medical History:  Diagnosis Date   Allergic rhinitis    Diabetes type 2, controlled (HCC)    Family history of colon cancer    Hepatic steatosis    Hypercalcemia    Hypertension    Hypertriglyceridemia    Recurrent pancreatitis    Vitamin D deficiency     Past Surgical History:  Procedure Laterality Date   COLONOSCOPY N/A 10/10/2021   Procedure: COLONOSCOPY;  Surgeon: Jinny Carmine, MD;  Location: [facility];  Service: Endoscopy;  Laterality: N/A;   COLONOSCOPY WITH PROPOFOL  N/A 06/18/2020   Procedure: COLONOSCOPY WITH PROPOFOL ;  Surgeon: Janalyn Keene NOVAK, MD;  Location: [facility];  Service: Endoscopy;  Laterality: N/A;   LAPAROSCOPIC CHOLECYSTECTOMY  05/09/14   Dr. Wonda GLENWOOD [facility]   POLYPECTOMY  10/10/2021   Procedure: POLYPECTOMY;  Surgeon: Jinny Carmine, MD;  Location: [facility];  Service: Endoscopy;;    Social History   Socioeconomic History   Marital status: Single    Spouse name: Not on file   Number of children: Not on file   Years of education: Not on file   Highest education level: Not on file  Occupational History  Not on file  Tobacco Use   Smoking status: Never   Smokeless tobacco: Never  Vaping Use   Vaping status: Never Used  Substance and Sexual Activity   Alcohol use: Yes    Alcohol/week: 0.0 standard drinks of alcohol    Comment: occassionally    Drug use: No   Sexual activity: Not on file  Other Topics Concern   Not on file  Social History Narrative   Not on file   Social Drivers of Health   Tobacco  Use: Low Risk (03/09/2024)   Patient History    Smoking Tobacco Use: Never    Smokeless Tobacco Use: Never    Passive Exposure: Not on file  Financial Resource Strain: Not on file  Food Insecurity: Not on file  Transportation Needs: Not on file  Physical Activity: Not on file  Stress: Not on file  Social Connections: Not on file  Intimate Partner Violence: Not on file  Depression (PHQ2-9): Low Risk (11/09/2023)   Depression (PHQ2-9)    PHQ-2 Score: 0  Alcohol Screen: Not on file  Housing: Not on file  Utilities: Not on file  Health Literacy: Not on file    Family History  Problem Relation Age of Onset   Diabetes Mother    Lymphoma Mother    COPD Mother    Stroke Father    COPD Father    Colon cancer Father     Allergies[1]  Review of Systems  Constitutional:  Negative for malaise/fatigue.  HENT: Negative.    Eyes:  Negative for blurred vision and pain.  Respiratory:  Negative for cough and shortness of breath.   Cardiovascular:  Negative for chest pain, palpitations, claudication and leg swelling.  Gastrointestinal:  Negative for abdominal pain, blood in stool, constipation, diarrhea, nausea and vomiting.  Genitourinary:  Negative for dysuria, frequency and urgency.  Musculoskeletal: Negative.   Skin: Negative.   Neurological:  Negative for dizziness, tingling, sensory change and headaches.  Endo/Heme/Allergies: Negative.   Psychiatric/Behavioral: Negative.         Objective:   BP (!) 160/80 (BP Location: Left Arm, Patient Position: Sitting, Cuff Size: Large)   Pulse 87   Ht 6' 2 (1.88 m)   Wt 228 lb 12.8 oz (103.8 kg)   SpO2 97%   BMI 29.38 kg/m   Vitals:   03/09/24 1449  BP: (!) 160/80  Pulse: 87  Height: 6' 2 (1.88 m)  Weight: 228 lb 12.8 oz (103.8 kg)  SpO2: 97%  BMI (Calculated): 29.36    Physical Exam Vitals and nursing note reviewed.  Constitutional:      Appearance: Normal appearance.  HENT:     Head: Normocephalic.  Eyes:      Extraocular Movements: Extraocular movements intact.     Pupils: Pupils are equal, round, and reactive to light.  Cardiovascular:     Rate and Rhythm: Normal rate and regular rhythm.     Pulses: Normal pulses.     Heart sounds: Normal heart sounds. No murmur heard. Pulmonary:     Effort: Pulmonary effort is normal. No respiratory distress.     Breath sounds: Normal breath sounds.  Abdominal:     General: There is no distension.     Tenderness: There is no abdominal tenderness.  Musculoskeletal:        General: No tenderness. Normal range of motion.     Cervical back: Normal range of motion and neck supple.     Right lower leg: No edema.  Left lower leg: No edema.  Skin:    General: Skin is warm and dry.     Coloration: Skin is not jaundiced.     Findings: No erythema.  Neurological:     General: No focal deficit present.     Mental Status: He is alert and oriented to person, place, and time.  Psychiatric:        Mood and Affect: Mood normal.        Speech: Speech normal.        Behavior: Behavior is cooperative.        Cognition and Memory: Memory is not impaired.      No results found for any visits on 03/09/24.  Recent Results (from the past 2160 hours)  POCT CBG (Fasting - Glucose)     Status: Abnormal   Collection Time: 12/16/23  3:23 PM  Result Value Ref Range   Glucose Fasting, POC 294 (A) 70 - 99 mg/dL  Basic metabolic panel with GFR     Status: Abnormal   Collection Time: 12/16/23  4:28 PM  Result Value Ref Range   Glucose 302 (H) 70 - 99 mg/dL   BUN 26 (H) 6 - 24 mg/dL   Creatinine, Ser 8.83 0.76 - 1.27 mg/dL   eGFR 74 >40 fO/fpw/8.26   BUN/Creatinine Ratio 22 (H) 9 - 20   Sodium 133 (L) 134 - 144 mmol/L   Potassium 4.2 3.5 - 5.2 mmol/L   Chloride 96 96 - 106 mmol/L   CO2 22 20 - 29 mmol/L   Calcium  10.3 (H) 8.7 - 10.2 mg/dL  POCT CBG (Fasting - Glucose)     Status: Abnormal   Collection Time: 12/27/23  3:04 PM  Result Value Ref Range   Glucose  Fasting, POC 177 (A) 70 - 99 mg/dL  POC CREATINE & ALBUMIN,URINE     Status: Abnormal   Collection Time: 12/27/23  3:46 PM  Result Value Ref Range   Microalbumin Ur, POC 150 mg/L   Creatinine, POC 100 mg/dL   Albumin/Creatinine Ratio, Urine, POC >300   Basic metabolic panel with GFR     Status: Abnormal   Collection Time: 12/27/23  3:54 PM  Result Value Ref Range   Glucose 199 (H) 70 - 99 mg/dL   BUN 29 (H) 6 - 24 mg/dL   Creatinine, Ser 8.73 0.76 - 1.27 mg/dL   eGFR 67 >40 fO/fpw/8.26   BUN/Creatinine Ratio 23 (H) 9 - 20   Sodium 132 (L) 134 - 144 mmol/L   Potassium 3.8 3.5 - 5.2 mmol/L   Chloride 94 (L) 96 - 106 mmol/L   CO2 21 20 - 29 mmol/L   Calcium  10.4 (H) 8.7 - 10.2 mg/dL  POCT CBG (Fasting - Glucose)     Status: Abnormal   Collection Time: 01/10/24  3:13 PM  Result Value Ref Range   Glucose Fasting, POC 280 (A) 70 - 99 mg/dL  POCT Urinalysis Dipstick (18997)     Status: Abnormal   Collection Time: 01/10/24  3:52 PM  Result Value Ref Range   Color, UA Yellow    Clarity, UA Clear    Glucose, UA Positive (A) Negative   Bilirubin, UA Negative    Ketones, UA Negative    Spec Grav, UA 1.020 1.010 - 1.025   Blood, UA Negative    pH, UA 5.5 5.0 - 8.0   Protein, UA Positive (A) Negative   Urobilinogen, UA 0.2 0.2 or 1.0 E.U./dL   Nitrite, UA  Negative    Leukocytes, UA Negative Negative   Appearance Clear    Odor Yes   POCT CBG (Fasting - Glucose)     Status: Abnormal   Collection Time: 01/24/24  2:58 PM  Result Value Ref Range   Glucose Fasting, POC 295 (A) 70 - 99 mg/dL  POCT XPERT XPRESS SARS COVID-2/FLU/RSV     Status: None   Collection Time: 01/24/24  4:19 PM  Result Value Ref Range   SARS Coronavirus 2 Negative    FLU A Negative    FLU B Negative    RSV RNA, PCR Negative   POCT CBG (Fasting - Glucose)     Status: Abnormal   Collection Time: 02/07/24  2:45 PM  Result Value Ref Range   Glucose Fasting, POC 245 (A) 70 - 99 mg/dL  TSH     Status: None    Collection Time: 02/09/24  8:47 AM  Result Value Ref Range   TSH 0.893 0.450 - 4.500 uIU/mL  Hemoglobin A1c     Status: Abnormal   Collection Time: 02/09/24  8:47 AM  Result Value Ref Range   Hgb A1c MFr Bld 10.8 (H) 4.8 - 5.6 %    Comment:          Prediabetes: 5.7 - 6.4          Diabetes: >6.4          Glycemic control for adults with diabetes: <7.0    Est. average glucose Bld gHb Est-mCnc 263 mg/dL  Lipid Profile     Status: Abnormal   Collection Time: 02/09/24  8:47 AM  Result Value Ref Range   Cholesterol, Total 224 (H) 100 - 199 mg/dL   Triglycerides 324 (HH) 0 - 149 mg/dL   HDL 30 (L) >60 mg/dL   VLDL Cholesterol Cal 110 (H) 5 - 40 mg/dL   LDL Chol Calc (NIH) 84 0 - 99 mg/dL   Chol/HDL Ratio 7.5 (H) 0.0 - 5.0 ratio    Comment:                                   T. Chol/HDL Ratio                                             Men  Women                               1/2 Avg.Risk  3.4    3.3                                   Avg.Risk  5.0    4.4                                2X Avg.Risk  9.6    7.1                                3X Avg.Risk 23.4   11.0   CMP14+EGFR     Status: Abnormal   Collection Time: 02/09/24  8:47 AM  Result Value Ref Range   Glucose 194 (H) 70 - 99 mg/dL   BUN 34 (H) 6 - 24 mg/dL   Creatinine, Ser 8.70 (H) 0.76 - 1.27 mg/dL   eGFR 65 >40 fO/fpw/8.26   BUN/Creatinine Ratio 26 (H) 9 - 20   Sodium 135 134 - 144 mmol/L   Potassium 4.2 3.5 - 5.2 mmol/L   Chloride 98 96 - 106 mmol/L   CO2 20 20 - 29 mmol/L   Calcium  10.2 8.7 - 10.2 mg/dL   Total Protein 7.5 6.0 - 8.5 g/dL   Albumin 4.5 3.8 - 4.9 g/dL   Globulin, Total 3.0 1.5 - 4.5 g/dL   Bilirubin Total 0.2 0.0 - 1.2 mg/dL   Alkaline Phosphatase 78 47 - 123 IU/L   AST 24 0 - 40 IU/L   ALT 28 0 - 44 IU/L       Assessment & Plan:   Assessment & Plan Hypertension associated with diabetes (HCC) Type 2 diabetes mellitus with hyperglycemia, with long-term current use of insulin  (HCC) Combined  hyperlipidemia associated with type 2 diabetes mellitus (HCC) Overweight with body mass index (BMI) of 29 to 29.9 in adult - Reinforced healthy diet and exercise as tolerated. - Increase sliding scale by 7 units 3 times daily with meals. - Increase Lisinopril  5 mg tablet by taking 4 tablets daily to finish remaining bottle. - Continue other medications as prescribed. - Refills sent. - Provided patient with Endo contact information. - Discussed future dexamethasone suppression test for uncontrolled blood sugars.    Return in about 2 months (around 05/09/2024) for fasting labs; morning time please.   Total time spent: 30 minutes  Oddis DELENA Cain, FNP  03/09/2024   This document may have been prepared by [facility] Voice Recognition software and as such may include unintentional dictation errors.     [1] No Known Allergies  "

## 2024-05-08 ENCOUNTER — Ambulatory Visit
